# Patient Record
Sex: Female | Born: 1955 | Race: Black or African American | Hispanic: No | Marital: Single | Smoking: Current some day smoker
Health system: Southern US, Community
[De-identification: ages and names within clinical notes are randomized; demographics above are authoritative.]

## PROBLEM LIST (undated history)

## (undated) DIAGNOSIS — E119 Type 2 diabetes mellitus without complications: Secondary | ICD-10-CM

## (undated) DIAGNOSIS — K219 Gastro-esophageal reflux disease without esophagitis: Secondary | ICD-10-CM

## (undated) DIAGNOSIS — I10 Essential (primary) hypertension: Secondary | ICD-10-CM

## (undated) DIAGNOSIS — J45909 Unspecified asthma, uncomplicated: Secondary | ICD-10-CM

## (undated) HISTORY — PX: EYE SURGERY: SHX253

---

## 2004-10-09 ENCOUNTER — Emergency Department (HOSPITAL_COMMUNITY): Admission: EM | Admit: 2004-10-09 | Discharge: 2004-10-09 | Payer: Self-pay | Admitting: Emergency Medicine

## 2004-11-02 ENCOUNTER — Emergency Department (HOSPITAL_COMMUNITY): Admission: EM | Admit: 2004-11-02 | Discharge: 2004-11-03 | Payer: Self-pay | Admitting: Emergency Medicine

## 2009-06-21 ENCOUNTER — Ambulatory Visit: Payer: Self-pay | Admitting: Diagnostic Radiology

## 2009-06-21 ENCOUNTER — Emergency Department (HOSPITAL_BASED_OUTPATIENT_CLINIC_OR_DEPARTMENT_OTHER): Admission: EM | Admit: 2009-06-21 | Discharge: 2009-06-21 | Payer: Self-pay | Admitting: Emergency Medicine

## 2009-09-16 ENCOUNTER — Emergency Department (HOSPITAL_COMMUNITY): Admission: EM | Admit: 2009-09-16 | Discharge: 2009-09-16 | Payer: Self-pay | Admitting: Emergency Medicine

## 2010-08-18 ENCOUNTER — Ambulatory Visit (HOSPITAL_COMMUNITY): Admission: RE | Admit: 2010-08-18 | Payer: Self-pay | Source: Home / Self Care | Admitting: Obstetrics and Gynecology

## 2010-10-22 LAB — CBC
HCT: 40.3 % (ref 36.0–46.0)
MCH: 32.3 pg (ref 26.0–34.0)
MCHC: 34.6 g/dL (ref 30.0–36.0)
RBC: 4.32 MIL/uL (ref 3.87–5.11)
WBC: 5.4 10*3/uL (ref 4.0–10.5)

## 2010-10-22 LAB — COMPREHENSIVE METABOLIC PANEL
ALT: 34 U/L (ref 0–35)
Alkaline Phosphatase: 91 U/L (ref 39–117)
Calcium: 9.2 mg/dL (ref 8.4–10.5)
GFR calc Af Amer: >60 mL/min (ref >60)
Glucose, Bld: 430 mg/dL — ABNORMAL HIGH (ref 70–99)
Sodium: 130 mEq/L — ABNORMAL LOW (ref 135–145)
Total Bilirubin: 0.3 mg/dL (ref 0.3–1.2)
Total Protein: 7.3 g/dL (ref 6.0–8.3)

## 2010-10-28 LAB — POCT PREGNANCY, URINE: Preg Test, Ur: NEGATIVE

## 2010-10-28 LAB — COMPREHENSIVE METABOLIC PANEL
ALT: 35 U/L (ref 0–35)
Albumin: 4.1 g/dL (ref 3.5–5.2)
BUN: 9 mg/dL (ref 6–23)
Chloride: 100 mEq/L (ref 96–112)
Creatinine, Ser: 0.71 mg/dL (ref 0.4–1.2)
GFR calc Af Amer: >60 mL/min (ref >60)
Glucose, Bld: 131 mg/dL — ABNORMAL HIGH (ref 70–99)
Potassium: 3.4 mEq/L — ABNORMAL LOW (ref 3.5–5.1)
Total Protein: 7.3 g/dL (ref 6.0–8.3)

## 2010-10-28 LAB — DIFFERENTIAL
Basophils Absolute: 0 10*3/uL (ref 0.0–0.1)
Basophils Relative: 1 % (ref 0–1)
Eosinophils Absolute: 0.1 10*3/uL (ref 0.0–0.7)
Lymphs Abs: 1.6 10*3/uL (ref 0.7–4.0)
Monocytes Relative: 6 % (ref 3–12)

## 2010-10-28 LAB — LIPASE, BLOOD: Lipase: 43 U/L (ref 11–59)

## 2010-10-28 LAB — CBC
HCT: 39.6 % (ref 36.0–46.0)
Hemoglobin: 13.5 g/dL (ref 12.0–15.0)
MCHC: 34.2 g/dL (ref 30.0–36.0)
MCV: 94 fL (ref 78.0–100.0)
WBC: 5.3 10*3/uL (ref 4.0–10.5)

## 2010-10-28 LAB — URINALYSIS, ROUTINE W REFLEX MICROSCOPIC
Bilirubin Urine: NEGATIVE
Glucose, UA: NEGATIVE mg/dL
Leukocytes, UA: NEGATIVE

## 2010-10-28 LAB — URINE MICROSCOPIC-ADD ON

## 2011-01-19 ENCOUNTER — Other Ambulatory Visit: Payer: Self-pay | Admitting: Obstetrics and Gynecology

## 2011-01-19 ENCOUNTER — Other Ambulatory Visit (HOSPITAL_COMMUNITY)
Admission: RE | Admit: 2011-01-19 | Discharge: 2011-01-19 | Disposition: A | Discharge status: Home / Self Care | Payer: Medicaid Other | Source: Ambulatory Visit | Attending: Obstetrics and Gynecology | Admitting: Obstetrics and Gynecology

## 2011-01-19 DIAGNOSIS — Z124 Encounter for screening for malignant neoplasm of cervix: Secondary | ICD-10-CM | POA: Insufficient documentation

## 2011-01-19 DIAGNOSIS — Z1231 Encounter for screening mammogram for malignant neoplasm of breast: Secondary | ICD-10-CM

## 2011-01-26 ENCOUNTER — Ambulatory Visit (HOSPITAL_BASED_OUTPATIENT_CLINIC_OR_DEPARTMENT_OTHER)
Admission: RE | Admit: 2011-01-26 | Discharge: 2011-01-26 | Disposition: A | Discharge status: Home / Self Care | Payer: Medicaid Other | Source: Ambulatory Visit | Attending: Obstetrics and Gynecology | Admitting: Obstetrics and Gynecology

## 2011-01-26 DIAGNOSIS — Z1231 Encounter for screening mammogram for malignant neoplasm of breast: Secondary | ICD-10-CM | POA: Insufficient documentation

## 2011-03-21 ENCOUNTER — Emergency Department (HOSPITAL_COMMUNITY)
Admission: EM | Admit: 2011-03-21 | Discharge: 2011-03-21 | Disposition: A | Discharge status: Home / Self Care | Payer: Medicaid Other | Attending: Emergency Medicine | Admitting: Emergency Medicine

## 2011-03-21 DIAGNOSIS — R269 Unspecified abnormalities of gait and mobility: Secondary | ICD-10-CM | POA: Insufficient documentation

## 2011-03-21 DIAGNOSIS — E119 Type 2 diabetes mellitus without complications: Secondary | ICD-10-CM | POA: Insufficient documentation

## 2011-03-21 DIAGNOSIS — K089 Disorder of teeth and supporting structures, unspecified: Secondary | ICD-10-CM | POA: Insufficient documentation

## 2011-03-21 DIAGNOSIS — I10 Essential (primary) hypertension: Secondary | ICD-10-CM | POA: Insufficient documentation

## 2011-03-21 DIAGNOSIS — K08109 Complete loss of teeth, unspecified cause, unspecified class: Secondary | ICD-10-CM | POA: Insufficient documentation

## 2011-03-21 DIAGNOSIS — K08409 Partial loss of teeth, unspecified cause, unspecified class: Secondary | ICD-10-CM | POA: Insufficient documentation

## 2011-03-21 DIAGNOSIS — M25559 Pain in unspecified hip: Secondary | ICD-10-CM | POA: Insufficient documentation

## 2012-07-24 ENCOUNTER — Emergency Department (HOSPITAL_COMMUNITY): Payer: Medicaid Other

## 2012-07-24 ENCOUNTER — Encounter (HOSPITAL_COMMUNITY): Payer: Self-pay | Admitting: *Deleted

## 2012-07-24 ENCOUNTER — Emergency Department (HOSPITAL_COMMUNITY)
Admission: EM | Admit: 2012-07-24 | Discharge: 2012-07-24 | Disposition: A | Discharge status: Home / Self Care | Payer: Medicaid Other | Attending: Emergency Medicine | Admitting: Emergency Medicine

## 2012-07-24 DIAGNOSIS — R61 Generalized hyperhidrosis: Secondary | ICD-10-CM | POA: Insufficient documentation

## 2012-07-24 DIAGNOSIS — R05 Cough: Secondary | ICD-10-CM | POA: Insufficient documentation

## 2012-07-24 DIAGNOSIS — J029 Acute pharyngitis, unspecified: Secondary | ICD-10-CM | POA: Insufficient documentation

## 2012-07-24 DIAGNOSIS — R509 Fever, unspecified: Secondary | ICD-10-CM | POA: Insufficient documentation

## 2012-07-24 DIAGNOSIS — B349 Viral infection, unspecified: Secondary | ICD-10-CM

## 2012-07-24 DIAGNOSIS — F17200 Nicotine dependence, unspecified, uncomplicated: Secondary | ICD-10-CM | POA: Insufficient documentation

## 2012-07-24 DIAGNOSIS — I10 Essential (primary) hypertension: Secondary | ICD-10-CM | POA: Insufficient documentation

## 2012-07-24 DIAGNOSIS — R059 Cough, unspecified: Secondary | ICD-10-CM | POA: Insufficient documentation

## 2012-07-24 DIAGNOSIS — J45901 Unspecified asthma with (acute) exacerbation: Secondary | ICD-10-CM | POA: Insufficient documentation

## 2012-07-24 DIAGNOSIS — R0602 Shortness of breath: Secondary | ICD-10-CM | POA: Insufficient documentation

## 2012-07-24 DIAGNOSIS — J3489 Other specified disorders of nose and nasal sinuses: Secondary | ICD-10-CM | POA: Insufficient documentation

## 2012-07-24 DIAGNOSIS — E119 Type 2 diabetes mellitus without complications: Secondary | ICD-10-CM | POA: Insufficient documentation

## 2012-07-24 DIAGNOSIS — B9789 Other viral agents as the cause of diseases classified elsewhere: Secondary | ICD-10-CM | POA: Insufficient documentation

## 2012-07-24 DIAGNOSIS — K219 Gastro-esophageal reflux disease without esophagitis: Secondary | ICD-10-CM | POA: Insufficient documentation

## 2012-07-24 DIAGNOSIS — Z79899 Other long term (current) drug therapy: Secondary | ICD-10-CM | POA: Insufficient documentation

## 2012-07-24 HISTORY — DX: Gastro-esophageal reflux disease without esophagitis: K21.9

## 2012-07-24 HISTORY — DX: Type 2 diabetes mellitus without complications: E11.9

## 2012-07-24 HISTORY — DX: Unspecified asthma, uncomplicated: J45.909

## 2012-07-24 HISTORY — DX: Essential (primary) hypertension: I10

## 2012-07-24 MED ORDER — HYDROCODONE-ACETAMINOPHEN 7.5-500 MG/15ML PO SOLN
10.0000 mL | Freq: Once | ORAL | Status: AC
  Administered 2012-07-24: 10 mL via ORAL
  Filled 2012-07-24: qty 15

## 2012-07-24 MED ORDER — GUAIFENESIN 100 MG/5ML PO LIQD: 100.0000 mg | ORAL | Status: DC | PRN

## 2012-07-24 MED ORDER — SODIUM CHLORIDE 0.9 % IV BOLUS (SEPSIS)
1000.0000 mL | Freq: Once | INTRAVENOUS | Status: AC
  Administered 2012-07-24: 1000 mL via INTRAVENOUS

## 2012-07-24 MED ORDER — DOXYCYCLINE HYCLATE 100 MG PO CAPS: 100.0000 mg | ORAL_CAPSULE | Freq: Two times a day (BID) | ORAL | Status: DC

## 2012-07-24 MED ORDER — AZITHROMYCIN 250 MG PO TABS
500.0000 mg | ORAL_TABLET | Freq: Once | ORAL | Status: AC
  Administered 2012-07-24: 500 mg via ORAL
  Filled 2012-07-24: qty 2

## 2012-07-24 MED ORDER — HYDROCODONE-ACETAMINOPHEN 7.5-500 MG/15ML PO SOLN: 15.0000 mL | Freq: Four times a day (QID) | ORAL | Status: DC | PRN

## 2012-07-24 MED ORDER — ONDANSETRON HCL 4 MG/2ML IJ SOLN: 4.0000 mg | Freq: Once | INTRAMUSCULAR | Status: DC

## 2012-07-24 NOTE — ED Provider Notes (Signed)
History     CSN: [id number]  Arrival date & time 07/24/12  1747   First MD Initiated Contact with Patient 07/24/12 1845      Chief Complaint  Patient presents with  . Emesis  . Cough    (Consider location/radiation/quality/duration/timing/severity/associated sxs/prior treatment) HPI  56 year old female with history of asthma, history of hypertension, and history of diabetes presents complaining of URI symptoms. Patient reports for the past week she has had gradual onset of runny nose, sneezing, productive cough, body aches, and pleuritic chest pain. She reports she has been coughing so much that she has post tussive emesis.  She reports trying many over-the-counter medication including NyQuil, Tussionex, Benadryl, Mucinex for the symptoms without relief. She does not have an inhaler at home and rarely ever needs it. No prior history of intubations or ICU stay secondary to her asthma.  She is an occasional smoker.  Her primary complaint today is her persistent cough.  Denies hemoptysis.  Denies rash. No new medication changes except OTC meds.    Past Medical History  Diagnosis Date  . Hypertension   . Diabetes mellitus without complication   . Acid reflux   . Asthma     Past Surgical History  Procedure Date  . Cesarean section     No family history on file.  History  Substance Use Topics  . Smoking status: Current Some Day Smoker  . Smokeless tobacco: Not on file  . Alcohol Use: No    OB History    Grav Para Term Preterm Abortions TAB SAB Ect Mult Living                  Review of Systems  Constitutional: Positive for fever, chills and diaphoresis.  HENT: Positive for congestion, sore throat, rhinorrhea, sneezing and postnasal drip. Negative for mouth sores, trouble swallowing, neck pain, neck stiffness and voice change.   Respiratory: Positive for cough, choking and shortness of breath. Negative for wheezing and stridor.   Cardiovascular: Negative for chest pain.   Gastrointestinal: Negative for abdominal pain.  Skin: Negative for rash.  Neurological: Negative for headaches.    Allergies  Review of patient's allergies indicates no known allergies.  Home Medications   Current Outpatient Rx  Name  Route  Sig  Dispense  Refill  . DICLOFENAC SODIUM 75 MG PO TBEC   Oral   Take 75 mg by mouth 2 (two) times daily.         Marland Kitchen DOXAZOSIN MESYLATE 1 MG PO TABS   Oral   Take 1 mg by mouth at bedtime.         Marland Kitchen LEVOTHYROXINE SODIUM 100 MCG PO TABS   Oral   Take 100 mcg by mouth daily.         Marland Kitchen METFORMIN HCL 1000 MG PO TABS   Oral   Take 1,000 mg by mouth 2 (two) times daily with a meal.         . OMEPRAZOLE 40 MG PO CPDR   Oral   Take 40 mg by mouth daily.         . QUETIAPINE FUMARATE 300 MG PO TABS   Oral   Take 600 mg by mouth 2 (two) times daily.           Pulse 88  Resp 24  SpO2 100%  Physical Exam  Nursing note and vitals reviewed. Constitutional: She is oriented to person, place, and time. She appears well-developed and well-nourished. She appears distressed (  Appears uncomfortable with persistent cough, diaphoresis).  HENT:  Right Ear: External ear normal.  Left Ear: External ear normal.       Rhinorrhea  Posterior oropharyngeal erythema without exudates or tonsillar enlargement. No evidence of deep tissues infection.  Eyes: Conjunctivae normal and EOM are normal. Pupils are equal, round, and reactive to light.  Neck: Normal range of motion. Neck supple. No JVD present.  Cardiovascular: Normal rate and regular rhythm.   Pulmonary/Chest: Effort normal and breath sounds normal. No stridor. No respiratory distress. She has no wheezes. She has no rales.  Abdominal: Soft. There is no tenderness.  Musculoskeletal: She exhibits no edema.  Lymphadenopathy:    She has no cervical adenopathy.  Neurological: She is alert and oriented to person, place, and time.  Skin: Skin is warm. No rash noted.  Psychiatric: She has a  normal mood and affect.    ED Course  Procedures (including critical care time)  Dg Chest 2 View  07/24/2012  *RADIOLOGY REPORT*  Clinical Data: Cough and shortness of breath  CHEST - 2 VIEW  Comparison: None.  Findings: The heart and pulmonary vascularity are within normal limits.  The lungs are clear.  No acute bony abnormality is seen.  IMPRESSION: No acute abnormality noted.   Original Report Authenticated By: Alcide Clever, M.D.      1. Viral syndrome  MDM  Patient presents with flu-like symptoms for over a week. She has persistent cough here in the ED. Her lung examination is unremarkable. The oxygen saturation is 100% on room air.  My plan is to give patient Lortab elixir for cough, Zithromax for potential infection, and fluid as she is diaphoretic, likely from persistent coughing.    7:36 PM Her chest x-ray shows no evidence of infection. Her abdomen is nonsurgical.  Her BP is normal at 139/93.  Sats at 100& on RA.  Pt stable for discharge.  Care discussed with my attending.    BP 139/93  Pulse 73  Resp 24  SpO2 98%   8:03 PM I initially was going to prescribed Zithromax for this patient.  However it can cause prolonged QT due to cross reaction of her seroquel.  Therefore, i suggest no antibiotic needed for her as this is likely viral syndrome.  However, doxy were prescribed for her to use if her sxs persist for more than 10 days.  Pt is aware and agree with plan.        Fayrene Helper, PA-C 07/24/12 2004

## 2012-07-24 NOTE — ED Notes (Signed)
Per ems: pt from home, c/o flu like symptoms x1 week. N/v/d and strong productive cough x1 week. Last vomited last night. Lung fields clear per ems. bp 218/80, pulse 80, respirations 20, saO2 100% ra

## 2012-07-25 NOTE — ED Provider Notes (Signed)
Medical screening examination/treatment/procedure(s) were performed by non-physician practitioner and as supervising physician I was immediately available for consultation/collaboration.   Robie Oats H Madalynne Gutmann, MD 07/25/12 1457 

## 2013-09-03 ENCOUNTER — Other Ambulatory Visit: Payer: Self-pay | Admitting: Nurse Practitioner

## 2013-09-03 DIAGNOSIS — Z1231 Encounter for screening mammogram for malignant neoplasm of breast: Secondary | ICD-10-CM

## 2013-09-10 ENCOUNTER — Ambulatory Visit
Admission: RE | Admit: 2013-09-10 | Discharge: 2013-09-10 | Disposition: A | Discharge status: Home / Self Care | Payer: Medicaid Other | Source: Ambulatory Visit | Attending: Nurse Practitioner | Admitting: Nurse Practitioner

## 2013-09-10 DIAGNOSIS — Z1231 Encounter for screening mammogram for malignant neoplasm of breast: Secondary | ICD-10-CM

## 2014-09-24 ENCOUNTER — Encounter (HOSPITAL_COMMUNITY): Payer: Self-pay | Admitting: Family Medicine

## 2014-09-24 ENCOUNTER — Emergency Department (HOSPITAL_COMMUNITY): Payer: Medicaid Other

## 2014-09-24 ENCOUNTER — Emergency Department (HOSPITAL_COMMUNITY)
Admission: EM | Admit: 2014-09-24 | Discharge: 2014-09-24 | Disposition: A | Discharge status: Home / Self Care | Payer: Medicaid Other | Attending: Emergency Medicine | Admitting: Emergency Medicine

## 2014-09-24 DIAGNOSIS — E1165 Type 2 diabetes mellitus with hyperglycemia: Secondary | ICD-10-CM | POA: Diagnosis not present

## 2014-09-24 DIAGNOSIS — R079 Chest pain, unspecified: Secondary | ICD-10-CM | POA: Diagnosis present

## 2014-09-24 DIAGNOSIS — Z792 Long term (current) use of antibiotics: Secondary | ICD-10-CM | POA: Insufficient documentation

## 2014-09-24 DIAGNOSIS — Z79899 Other long term (current) drug therapy: Secondary | ICD-10-CM | POA: Diagnosis not present

## 2014-09-24 DIAGNOSIS — K219 Gastro-esophageal reflux disease without esophagitis: Secondary | ICD-10-CM | POA: Diagnosis not present

## 2014-09-24 DIAGNOSIS — R05 Cough: Secondary | ICD-10-CM

## 2014-09-24 DIAGNOSIS — J45909 Unspecified asthma, uncomplicated: Secondary | ICD-10-CM | POA: Insufficient documentation

## 2014-09-24 DIAGNOSIS — I10 Essential (primary) hypertension: Secondary | ICD-10-CM | POA: Diagnosis not present

## 2014-09-24 DIAGNOSIS — Z720 Tobacco use: Secondary | ICD-10-CM | POA: Diagnosis not present

## 2014-09-24 DIAGNOSIS — Z791 Long term (current) use of non-steroidal anti-inflammatories (NSAID): Secondary | ICD-10-CM | POA: Diagnosis not present

## 2014-09-24 DIAGNOSIS — R059 Cough, unspecified: Secondary | ICD-10-CM

## 2014-09-24 LAB — CBC WITH DIFFERENTIAL/PLATELET
Basophils Absolute: 0 10*3/uL (ref 0.0–0.1)
Basophils Relative: 0 % (ref 0–1)
EOS ABS: 0 10*3/uL (ref 0.0–0.7)
EOS PCT: 1 % (ref 0–5)
HCT: 31.5 % — ABNORMAL LOW (ref 36.0–46.0)
HEMOGLOBIN: 9.8 g/dL — AB (ref 12.0–15.0)
LYMPHS ABS: 0.8 10*3/uL (ref 0.7–4.0)
Lymphocytes Relative: 17 % (ref 12–46)
MCH: 32.1 pg (ref 26.0–34.0)
MCHC: 31.1 g/dL (ref 30.0–36.0)
MCV: 103.3 fL — ABNORMAL HIGH (ref 78.0–100.0)
MONOS PCT: 6 % (ref 3–12)
Monocytes Absolute: 0.3 10*3/uL (ref 0.1–1.0)
Neutro Abs: 3.8 10*3/uL (ref 1.7–7.7)
Neutrophils Relative %: 76 % (ref 43–77)
Platelets: 203 10*3/uL (ref 150–400)
RBC: 3.05 MIL/uL — ABNORMAL LOW (ref 3.87–5.11)
RDW: 13.6 % (ref 11.5–15.5)
WBC: 5 10*3/uL (ref 4.0–10.5)

## 2014-09-24 LAB — I-STAT CHEM 8, ED
BUN: 5 mg/dL — AB (ref 6–23)
CALCIUM ION: 0.88 mmol/L — AB (ref 1.12–1.23)
CHLORIDE: 111 mmol/L (ref 96–112)
CREATININE: 0.4 mg/dL — AB (ref 0.50–1.10)
GLUCOSE: 90 mg/dL (ref 70–99)
HCT: 32 % — ABNORMAL LOW (ref 36.0–46.0)
Hemoglobin: 10.9 g/dL — ABNORMAL LOW (ref 12.0–15.0)
POTASSIUM: 2.6 mmol/L — AB (ref 3.5–5.1)
Sodium: 144 mmol/L (ref 135–145)
TCO2: 15 mmol/L (ref 0–100)

## 2014-09-24 LAB — BASIC METABOLIC PANEL
ANION GAP: 5 (ref 5–15)
BUN: 7 mg/dL (ref 6–23)
CALCIUM: 6 mg/dL — AB (ref 8.4–10.5)
CO2: 19 mmol/L (ref 19–32)
CREATININE: 0.32 mg/dL — AB (ref 0.50–1.10)
Chloride: 115 mmol/L — ABNORMAL HIGH (ref 96–112)
GFR calc Af Amer: >90 mL/min (ref >90)
GFR calc non Af Amer: >90 mL/min (ref >90)
Glucose, Bld: 91 mg/dL (ref 70–99)
POTASSIUM: 2.7 mmol/L — AB (ref 3.5–5.1)
Sodium: 139 mmol/L (ref 135–145)

## 2014-09-24 LAB — CBG MONITORING, ED: GLUCOSE-CAPILLARY: 129 mg/dL — AB (ref 70–99)

## 2014-09-24 LAB — D-DIMER, QUANTITATIVE: D-Dimer, Quant: <0.27 ug/mL-FEU (ref 0.00–0.48)

## 2014-09-24 MED ORDER — IOHEXOL 350 MG/ML SOLN
100.0000 mL | Freq: Once | INTRAVENOUS | Status: AC | PRN
  Administered 2014-09-24: 100 mL via INTRAVENOUS

## 2014-09-24 MED ORDER — POTASSIUM CHLORIDE 10 MEQ/100ML IV SOLN
10.0000 meq | Freq: Once | INTRAVENOUS | Status: AC
Start: 2014-09-24 — End: 2014-09-24
  Administered 2014-09-24: 10 meq via INTRAVENOUS
  Filled 2014-09-24: qty 100

## 2014-09-24 MED ORDER — POTASSIUM CHLORIDE CRYS ER 20 MEQ PO TBCR
60.0000 meq | EXTENDED_RELEASE_TABLET | Freq: Once | ORAL | Status: AC
  Administered 2014-09-24: 60 meq via ORAL
  Filled 2014-09-24: qty 3

## 2014-09-24 MED ORDER — MAGNESIUM SULFATE 2 GM/50ML IV SOLN
2.0000 g | Freq: Once | INTRAVENOUS | Status: AC
  Administered 2014-09-24: 2 g via INTRAVENOUS
  Filled 2014-09-24: qty 50

## 2014-09-24 MED ORDER — SODIUM CHLORIDE 0.9 % IV SOLN
2.0000 g | Freq: Once | INTRAVENOUS | Status: AC
  Administered 2014-09-24: 2 g via INTRAVENOUS
  Filled 2014-09-24: qty 20

## 2014-09-24 MED ORDER — SODIUM CHLORIDE 0.9 % IV BOLUS (SEPSIS)
1000.0000 mL | Freq: Once | INTRAVENOUS | Status: AC
  Administered 2014-09-24: 1000 mL via INTRAVENOUS

## 2014-09-24 MED ORDER — IOHEXOL 300 MG/ML  SOLN: 100.0000 mL | Freq: Once | INTRAMUSCULAR | Status: DC | PRN

## 2014-09-24 MED ORDER — LORAZEPAM 2 MG/ML IJ SOLN
1.0000 mg | Freq: Once | INTRAMUSCULAR | Status: AC
  Administered 2014-09-24: 1 mg via INTRAVENOUS
  Filled 2014-09-24: qty 1

## 2014-09-24 NOTE — ED Notes (Signed)
KAREN UNIT SECT. WITNESSED PT USING PHONE. PT AWARE- #1 OF HER PHONE

## 2014-09-24 NOTE — ED Provider Notes (Signed)
CSN: [id number]     Arrival date & time 09/24/14  0341 History   First MD Initiated Contact with Patient 09/24/14 0354     Chief Complaint  Patient presents with  . Chest Pain  . Cough  . Hypertension  . Hyperglycemia     (Consider location/radiation/quality/duration/timing/severity/associated sxs/prior Treatment) HPI  Jodi Kim is a 59 y.o. female with past medical history of hypertension, diabetes presenting today with chest pain. Patient states he smoked crack cocaine. She then had right-sided chest pain with radiation to her back. She tried to treat her pain with alcohol however this did not help. She denies any exertional component. She does have shortness of breath, there is no emesis or diaphoresis. Patient does have a history of DVT in the right lower extremity many years ago. She denies any history of pulmonary embolism. Patient has no further complaints.  10 Systems reviewed and are negative for acute change except as noted in the HPI.     Past Medical History  Diagnosis Date  . Hypertension   . Diabetes mellitus without complication   . Acid reflux   . Asthma    Past Surgical History  Procedure Laterality Date  . Cesarean section     History reviewed. No pertinent family history. History  Substance Use Topics  . Smoking status: Current Some Day Smoker    Types: Cigarettes  . Smokeless tobacco: Not on file  . Alcohol Use: Yes     Comment: Twice a week. Last drink: PTA. Beer 40oz and some liqour   OB History    No data available     Review of Systems    Allergies  Review of patient's allergies indicates no known allergies.  Home Medications   Prior to Admission medications   Medication Sig Start Date End Date Taking? Authorizing Provider  diclofenac (VOLTAREN) 75 MG EC tablet Take 75 mg by mouth 2 (two) times daily.    Historical Provider, MD  doxazosin (CARDURA) 1 MG tablet Take 1 mg by mouth at bedtime.    Historical Provider, MD  doxycycline  (VIBRAMYCIN) 100 MG capsule Take 1 capsule (100 mg total) by mouth 2 (two) times daily. 07/24/12   Fayrene Helper, PA-C  guaiFENesin (ROBITUSSIN) 100 MG/5ML liquid Take 5-10 mLs (100-200 mg total) by mouth every 4 (four) hours as needed for cough. 07/24/12   Fayrene Helper, PA-C  HYDROcodone-acetaminophen (LORTAB) 7.5-500 MG/15ML solution Take 15 mLs by mouth every 6 (six) hours as needed for pain or cough. 07/24/12   Fayrene Helper, PA-C  levothyroxine (SYNTHROID, LEVOTHROID) 100 MCG tablet Take 100 mcg by mouth daily.    Historical Provider, MD  metFORMIN (GLUCOPHAGE) 1000 MG tablet Take 1,000 mg by mouth 2 (two) times daily with a meal.    Historical Provider, MD  omeprazole (PRILOSEC) 40 MG capsule Take 40 mg by mouth daily.    Historical Provider, MD  QUEtiapine (SEROQUEL) 300 MG tablet Take 600 mg by mouth 2 (two) times daily.    Historical Provider, MD   BP 195/103 mmHg  Pulse 82  Temp(Src) 98.5 F (36.9 C) (Oral)  Resp 20  Ht  (1.626 m)  Wt 263 lb (119.296 kg)  BMI 45.12 kg/m2  SpO2 99% Physical Exam  Constitutional: She is oriented to person, place, and time. She appears well-developed and well-nourished. She appears distressed.  HENT:  Head: Normocephalic and atraumatic.  Nose: Nose normal.  Mouth/Throat: Oropharynx is clear and moist. No oropharyngeal exudate.  Eyes: Conjunctivae and  EOM are normal. Pupils are equal, round, and reactive to light. No scleral icterus.  Neck: Normal range of motion. Neck supple. No JVD present. No tracheal deviation present. No thyromegaly present.  Cardiovascular: Normal rate, regular rhythm and normal heart sounds.  Exam reveals no gallop and no friction rub.   No murmur heard. Pulmonary/Chest: Effort normal and breath sounds normal. No respiratory distress. She has no wheezes. She exhibits no tenderness.  Abdominal: Soft. Bowel sounds are normal. She exhibits no distension and no mass. There is no tenderness. There is no rebound and no guarding.   Musculoskeletal: Normal range of motion. She exhibits no edema or tenderness.  Lymphadenopathy:    She has no cervical adenopathy.  Neurological: She is alert and oriented to person, place, and time. No cranial nerve deficit. She exhibits normal muscle tone.  Skin: Skin is warm and dry. No rash noted. She is not diaphoretic. No erythema. No pallor.  Nursing note and vitals reviewed.   ED Course  Procedures (including critical care time) Labs Review Labs Reviewed  CBC WITH DIFFERENTIAL/PLATELET - Abnormal; Notable for the following:    RBC 3.05 (*)    Hemoglobin 9.8 (*)    HCT 31.5 (*)    MCV 103.3 (*)    All other components within normal limits  BASIC METABOLIC PANEL - Abnormal; Notable for the following:    Potassium 2.7 (*)    Chloride 115 (*)    Creatinine, Ser 0.32 (*)    Calcium 6.0 (*)    All other components within normal limits  CBG MONITORING, ED - Abnormal; Notable for the following:    Glucose-Capillary 129 (*)    All other components within normal limits  I-STAT CHEM 8, ED - Abnormal; Notable for the following:    Potassium 2.6 (*)    BUN 5 (*)    Creatinine, Ser 0.40 (*)    Calcium, Ion 0.88 (*)    Hemoglobin 10.9 (*)    HCT 32.0 (*)    All other components within normal limits  D-DIMER, QUANTITATIVE    Imaging Review Dg Chest 2 View  09/24/2014   CLINICAL DATA:  Cough and midchest pain, 1 day duration. Initial encounter.  EXAM: CHEST  2 VIEW  COMPARISON:  07/24/2012  FINDINGS: The heart size and mediastinal contours are within normal limits. Both lungs are clear. The visualized skeletal structures are unremarkable.  There is no significant interval change.  IMPRESSION: No active cardiopulmonary disease.   Electronically Signed   By: Ellery Plunk M.D.   On: 09/24/2014 04:10   Ct Angio Chest Aorta W/cm &/or Wo/cm  09/24/2014   CLINICAL DATA:  Cough and chest pain.  EXAM: CT ANGIOGRAPHY CHEST WITH CONTRAST  TECHNIQUE: Multidetector CT imaging of the chest  was performed using the standard protocol during bolus administration of intravenous contrast. Multiplanar CT image reconstructions and MIPs were obtained to evaluate the vascular anatomy.  CONTRAST:  OMNIPAQUE IOHEXOL 350 MG/ML SOLN  COMPARISON:  Radiograph 09/24/2014  FINDINGS: There is good opacification of pulmonary vasculature but moderate motion artifact. There are no large or central pulmonary emboli. A few nonspecific nodes are present in the mediastinum and hila. No large effusions are evident. The lungs are grossly clear, without significant confluent consolidation. Small nodules or masses cannot be excluded due to the motion artifact.  Review of the MIP images confirms the above findings.  IMPRESSION: 1. The images are moderately degraded by motion artifact as the patient was unable to  cooperate with instructions. 2. No large or central pulmonary emboli 3. No large effusions or airspace consolidation   Electronically Signed   By: Ellery Plunkaniel R Mitchell M.D.   On: 09/24/2014 06:09     EKG Interpretation   Date/Time:  Tuesday September 24 2014 03:48:14 EST Ventricular Rate:  83 PR Interval:  136 QRS Duration: 84 QT Interval:  398 QTC Calculation: 468 R Axis:   59 Text Interpretation:  Sinus rhythm Probable left atrial enlargement  Anteroseptal infarct, old Nonspecific T abnormalities, lateral leads  Confirmed by Erroll Lunani, Andros Channing Ayokunle [phone]([id number]) on 09/24/2014 3:54:19 AM      MDM   Final diagnoses:  Cough    Patient presents emergency department for chest pain, in the setting of recent crack cocaine use. She describes it as radiating to her back. This history is not consistent with ACS.  Pulmonary Embolism is a possibility as the patient has a history of clots although less likely in this situation. Aortic dissection is also possible given her recent history of cocaine use and ripping back pain radiating to her back. EKG is nonischemic. Will evaluate with CT scan, patient given IV fluids  and Ativan for treatment.  CT scan does not show any evidence of PE.  I called the radiologist to confirm that there is no dissection as well, and he did see anything. Patient's electrolytes will replace potassium, magnesium, calcium. I do not believe her chest pain is consistent with ACS. Is likely due to her recent use of crack cocaine. Patient's vital signs remain within her normal limits. Hypertension is likely due to cocaine use. Patient will be discharged with primary care follow-up. She is safe for discharge.  Tomasita CrumbleAdeleke Davidlee Jeanbaptiste, MD 09/24/14 [phone]

## 2014-09-24 NOTE — ED Notes (Signed)
Daughter present

## 2014-09-24 NOTE — Discharge Instructions (Signed)
Chest Pain (Nonspecific) [name], your CT scan did not show a cause for your chest pain. Follow-up with a primary care physician within 3 days for continued management. If symptoms worsen come back to the emergency department immediately. Thank you. It is often hard to give a diagnosis for the cause of chest pain. There is always a chance that your pain could be related to something serious, such as a heart attack or a blood clot in the lungs. You need to follow up with your doctor. HOME CARE  If antibiotic medicine was given, take it as directed by your doctor. Finish the medicine even if you start to feel better.  For the next few days, avoid activities that bring on chest pain. Continue physical activities as told by your doctor.  Do not use any tobacco products. This includes cigarettes, chewing tobacco, and e-cigarettes.  Avoid drinking alcohol.  Only take medicine as told by your doctor.  Follow your doctor's suggestions for more testing if your chest pain does not go away.  Keep all doctor visits you made. GET HELP IF:  Your chest pain does not go away, even after treatment.  You have a rash with blisters on your chest.  You have a fever. GET HELP RIGHT AWAY IF:   You have more pain or pain that spreads to your arm, neck, jaw, back, or belly (abdomen).  You have shortness of breath.  You cough more than usual or cough up blood.  You have very bad back or belly pain.  You feel sick to your stomach (nauseous) or throw up (vomit).  You have very bad weakness.  You pass out (faint).  You have chills. This is an emergency. Do not wait to see if the problems will go away. Call your local emergency services (911 in [place] Do not drive yourself to the hospital. MAKE SURE YOU:   Understand these instructions.  Will watch your condition.  Will get help right away if you are not doing well or get worse. Document Released: 01/12/2008 Document Revised: 07/31/2013  Document Reviewed: 01/12/2008 [employer] Patient Information 2015 [place], [place]. This information is not intended to replace advice given to you by your health care provider. Make sure you discuss any questions you have with your health care provider.

## 2014-09-24 NOTE — ED Notes (Signed)
Critical value of POTASSIUM 2.7mg  and CALCIUM 6.0. Confirmed by Vira BrownsWendy Shea in Lab. Notified Dr. Mora Bellmanni.

## 2014-09-24 NOTE — ED Notes (Signed)
DAUGHTER STATED PT ALREADY HAS A PCP. DISCUSSED WITH DAUGHTER TO FOLLOW UP WITH PCP IN 3 DAYS PER DISCHARGE. DISCUSSED METFORMIN PAPERWORK PER CT SCAN INSTRUCTIONS. DAUGHTER VERBALIZED UNDERSTANDING HOWEVER SHE STATED MOTHER WOULD NOT BE LIVING WITH HER.

## 2014-09-24 NOTE — ED Notes (Signed)
Bed: WA10 Expected date: 09/24/14 Expected time: 3:29 AM Means of arrival: Ambulance Comments: HTN, hyperglycemia

## 2014-09-24 NOTE — ED Notes (Signed)
Per Velna HatchetSheila in main lab, BMP to be added to blood collected at 0450hrs

## 2015-02-20 ENCOUNTER — Encounter (HOSPITAL_COMMUNITY): Payer: Self-pay | Admitting: Emergency Medicine

## 2015-02-20 ENCOUNTER — Emergency Department (HOSPITAL_COMMUNITY)
Admission: EM | Admit: 2015-02-20 | Discharge: 2015-02-20 | Disposition: A | Discharge status: Home / Self Care | Payer: Medicaid Other | Attending: Emergency Medicine | Admitting: Emergency Medicine

## 2015-02-20 DIAGNOSIS — J45909 Unspecified asthma, uncomplicated: Secondary | ICD-10-CM | POA: Insufficient documentation

## 2015-02-20 DIAGNOSIS — K219 Gastro-esophageal reflux disease without esophagitis: Secondary | ICD-10-CM | POA: Insufficient documentation

## 2015-02-20 DIAGNOSIS — R51 Headache: Secondary | ICD-10-CM | POA: Diagnosis not present

## 2015-02-20 DIAGNOSIS — I10 Essential (primary) hypertension: Secondary | ICD-10-CM | POA: Diagnosis present

## 2015-02-20 DIAGNOSIS — Z79899 Other long term (current) drug therapy: Secondary | ICD-10-CM | POA: Diagnosis not present

## 2015-02-20 DIAGNOSIS — H538 Other visual disturbances: Secondary | ICD-10-CM | POA: Insufficient documentation

## 2015-02-20 DIAGNOSIS — I159 Secondary hypertension, unspecified: Secondary | ICD-10-CM | POA: Diagnosis not present

## 2015-02-20 DIAGNOSIS — Z720 Tobacco use: Secondary | ICD-10-CM | POA: Insufficient documentation

## 2015-02-20 DIAGNOSIS — E119 Type 2 diabetes mellitus without complications: Secondary | ICD-10-CM | POA: Insufficient documentation

## 2015-02-20 DIAGNOSIS — F1410 Cocaine abuse, uncomplicated: Secondary | ICD-10-CM | POA: Insufficient documentation

## 2015-02-20 MED ORDER — HYDRALAZINE HCL 20 MG/ML IJ SOLN
10.0000 mg | Freq: Once | INTRAMUSCULAR | Status: AC
  Administered 2015-02-20: 10 mg via INTRAVENOUS
  Filled 2015-02-20: qty 1

## 2015-02-20 NOTE — ED Provider Notes (Signed)
I saw and evaluated the patient, reviewed the resident's note and I agree with the findings and plan.   EKG Interpretation   Date/Time:  Thursday February 20 2015 18:03:38 EDT Ventricular Rate:  73 PR Interval:  137 QRS Duration: 85 QT Interval:  427 QTC Calculation: 470 R Axis:   65 Text Interpretation:  Sinus arrhythmia Anteroseptal infarct, age  indeterminate No significant change since last tracing Confirmed by Jayr Lupercio   MD, Quin Mcpherson ([id number]) on 02/20/2015 7:14:14 PM     Patient here in custody of law enforcement complaining of high blood pressure and dizziness. Possible cocaine use. No neurological deficits. Denies any chest pain. Given hydralazine here. We'll continue to monitor  Lorre NickAnthony Shyvonne Chastang, MD 02/20/15 1941

## 2015-02-20 NOTE — ED Notes (Signed)
Pt arrives via [facility] in [employer] custody. Pt has hx of crack/cocaine abuse, last use today. Reports htn and dizziness, pt noncompliant with her medications. Pt alert, oriented.

## 2015-02-20 NOTE — ED Notes (Signed)
Lab contacted for cbc and bmet results. Advised they are tubing results to ed now.

## 2015-02-20 NOTE — Discharge Instructions (Signed)
Hypertension Hypertension is another name for high blood pressure. High blood pressure forces your heart to work harder to pump blood. A blood pressure reading has two numbers, which includes a higher number over a lower number (example: 110/72). HOME CARE   Have your blood pressure rechecked by your doctor.  Only take medicine as told by your doctor. Follow the directions carefully. The medicine does not work as well if you skip doses. Skipping doses also puts you at risk for problems.  Do not smoke.  Monitor your blood pressure at home as told by your doctor. GET HELP IF:  You think you are having a reaction to the medicine you are taking.  You have repeat headaches or feel dizzy.  You have puffiness (swelling) in your ankles.  You have trouble with your vision. GET HELP RIGHT AWAY IF:   You get a very bad headache and are confused.  You feel weak, numb, or faint.  You get chest or belly (abdominal) pain.  You throw up (vomit).  You cannot breathe very well. MAKE SURE YOU:   Understand these instructions.  Will watch your condition.  Will get help right away if you are not doing well or get worse. Document Released: 01/12/2008 Document Revised: 07/31/2013 Document Reviewed: 05/18/2013 [employer] Patient Information 2015 [employer]. This information is not intended to replace advice given to you by your health care provider. Make sure you discuss any questions you have with your health care provider.  

## 2015-02-20 NOTE — ED Notes (Signed)
MD at bedside. 
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
## 2015-02-20 NOTE — ED Notes (Signed)
Lab results given to Dr.Allen. 

## 2015-02-21 LAB — BASIC METABOLIC PANEL
ANION GAP: 12 (ref 5–15)
BUN: 12 mg/dL (ref 6–20)
CO2: 22 mmol/L (ref 22–32)
Calcium: 8.7 mg/dL — ABNORMAL LOW (ref 8.9–10.3)
Chloride: 106 mmol/L (ref 101–111)
Creatinine, Ser: 0.76 mg/dL (ref 0.44–1.00)
GFR calc Af Amer: >60 mL/min (ref >60)
GFR calc non Af Amer: >60 mL/min (ref >60)
GLUCOSE: 115 mg/dL — AB (ref 65–99)
POTASSIUM: 3.8 mmol/L (ref 3.5–5.1)
Sodium: 140 mmol/L (ref 135–145)

## 2015-02-21 LAB — RAPID URINE DRUG SCREEN, HOSP PERFORMED
AMPHETAMINES: NOT DETECTED
BARBITURATES: NOT DETECTED
BENZODIAZEPINES: NOT DETECTED
Cocaine: POSITIVE — AB
Opiates: NOT DETECTED
Tetrahydrocannabinol: NOT DETECTED

## 2015-02-21 LAB — I-STAT TROPONIN, ED: Troponin i, poc: 0 ng/mL (ref 0.00–0.08)

## 2015-02-21 LAB — CBC
HEMATOCRIT: 44.2 % (ref 36.0–46.0)
Hemoglobin: 14.5 g/dL (ref 12.0–15.0)
MCH: 33 pg (ref 26.0–34.0)
MCHC: 32.8 g/dL (ref 30.0–36.0)
MCV: 100.7 fL — ABNORMAL HIGH (ref 78.0–100.0)
Platelets: 266 10*3/uL (ref 150–400)
RBC: 4.39 MIL/uL (ref 3.87–5.11)
RDW: 13.8 % (ref 11.5–15.5)
WBC: 6.5 10*3/uL (ref 4.0–10.5)

## 2021-04-07 ENCOUNTER — Other Ambulatory Visit: Payer: Self-pay

## 2021-04-07 ENCOUNTER — Emergency Department (HOSPITAL_BASED_OUTPATIENT_CLINIC_OR_DEPARTMENT_OTHER): Payer: Medicaid Other

## 2021-04-07 ENCOUNTER — Emergency Department (HOSPITAL_BASED_OUTPATIENT_CLINIC_OR_DEPARTMENT_OTHER)
Admission: EM | Admit: 2021-04-07 | Discharge: 2021-04-07 | Disposition: A | Discharge status: Home / Self Care | Payer: Medicaid Other | Attending: Emergency Medicine | Admitting: Emergency Medicine

## 2021-04-07 ENCOUNTER — Encounter (HOSPITAL_BASED_OUTPATIENT_CLINIC_OR_DEPARTMENT_OTHER): Payer: Self-pay | Admitting: Emergency Medicine

## 2021-04-07 DIAGNOSIS — Z7984 Long term (current) use of oral hypoglycemic drugs: Secondary | ICD-10-CM | POA: Insufficient documentation

## 2021-04-07 DIAGNOSIS — R0789 Other chest pain: Secondary | ICD-10-CM | POA: Diagnosis not present

## 2021-04-07 DIAGNOSIS — F17210 Nicotine dependence, cigarettes, uncomplicated: Secondary | ICD-10-CM | POA: Insufficient documentation

## 2021-04-07 DIAGNOSIS — I10 Essential (primary) hypertension: Secondary | ICD-10-CM | POA: Insufficient documentation

## 2021-04-07 DIAGNOSIS — Y92410 Unspecified street and highway as the place of occurrence of the external cause: Secondary | ICD-10-CM | POA: Diagnosis not present

## 2021-04-07 DIAGNOSIS — M25562 Pain in left knee: Secondary | ICD-10-CM | POA: Insufficient documentation

## 2021-04-07 DIAGNOSIS — R519 Headache, unspecified: Secondary | ICD-10-CM | POA: Insufficient documentation

## 2021-04-07 DIAGNOSIS — M25552 Pain in left hip: Secondary | ICD-10-CM | POA: Diagnosis not present

## 2021-04-07 DIAGNOSIS — Z7951 Long term (current) use of inhaled steroids: Secondary | ICD-10-CM | POA: Insufficient documentation

## 2021-04-07 DIAGNOSIS — J45909 Unspecified asthma, uncomplicated: Secondary | ICD-10-CM | POA: Insufficient documentation

## 2021-04-07 DIAGNOSIS — M542 Cervicalgia: Secondary | ICD-10-CM | POA: Diagnosis not present

## 2021-04-07 DIAGNOSIS — Z79899 Other long term (current) drug therapy: Secondary | ICD-10-CM | POA: Insufficient documentation

## 2021-04-07 DIAGNOSIS — E119 Type 2 diabetes mellitus without complications: Secondary | ICD-10-CM | POA: Diagnosis not present

## 2021-04-07 DIAGNOSIS — R209 Unspecified disturbances of skin sensation: Secondary | ICD-10-CM | POA: Insufficient documentation

## 2021-04-07 MED ORDER — ACETAMINOPHEN 325 MG PO TABS
650.0000 mg | ORAL_TABLET | Freq: Once | ORAL | Status: AC
  Administered 2021-04-07: 650 mg via ORAL
  Filled 2021-04-07: qty 2

## 2021-04-07 MED ORDER — CYCLOBENZAPRINE HCL 5 MG PO TABS
5.0000 mg | ORAL_TABLET | Freq: Once | ORAL | Status: AC
  Administered 2021-04-07: 5 mg via ORAL
  Filled 2021-04-07: qty 1
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
## 2021-04-07 NOTE — ED Notes (Signed)
ED Provider at bedside. 

## 2021-04-07 NOTE — ED Notes (Signed)
Pt provided bedside commode, assistance to bedside
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
## 2021-04-07 NOTE — ED Notes (Signed)
Pt discharged to home. Discharge instructions have been discussed with patient and/or family members. Pt verbally acknowledges understanding d/c instructions, and endorses comprehension to checkout at registration before leaving.  °
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
## 2021-04-07 NOTE — ED Notes (Signed)
Patient transported to X-ray 
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
## 2021-06-04 ENCOUNTER — Other Ambulatory Visit: Payer: Self-pay

## 2021-06-04 ENCOUNTER — Emergency Department (HOSPITAL_BASED_OUTPATIENT_CLINIC_OR_DEPARTMENT_OTHER)
Admission: EM | Admit: 2021-06-04 | Discharge: 2021-06-04 | Disposition: A | Discharge status: Home / Self Care | Payer: Medicaid Other | Attending: Emergency Medicine | Admitting: Emergency Medicine

## 2021-06-04 ENCOUNTER — Emergency Department (HOSPITAL_BASED_OUTPATIENT_CLINIC_OR_DEPARTMENT_OTHER): Payer: Medicaid Other

## 2021-06-04 DIAGNOSIS — Z20822 Contact with and (suspected) exposure to covid-19: Secondary | ICD-10-CM | POA: Diagnosis not present

## 2021-06-04 DIAGNOSIS — R059 Cough, unspecified: Secondary | ICD-10-CM | POA: Diagnosis not present

## 2021-06-04 DIAGNOSIS — F17210 Nicotine dependence, cigarettes, uncomplicated: Secondary | ICD-10-CM | POA: Insufficient documentation

## 2021-06-04 DIAGNOSIS — E876 Hypokalemia: Secondary | ICD-10-CM

## 2021-06-04 DIAGNOSIS — E119 Type 2 diabetes mellitus without complications: Secondary | ICD-10-CM | POA: Diagnosis not present

## 2021-06-04 DIAGNOSIS — R509 Fever, unspecified: Secondary | ICD-10-CM | POA: Diagnosis not present

## 2021-06-04 DIAGNOSIS — J45909 Unspecified asthma, uncomplicated: Secondary | ICD-10-CM | POA: Insufficient documentation

## 2021-06-04 DIAGNOSIS — R0602 Shortness of breath: Secondary | ICD-10-CM | POA: Diagnosis present

## 2021-06-04 DIAGNOSIS — I10 Essential (primary) hypertension: Secondary | ICD-10-CM | POA: Insufficient documentation

## 2021-06-04 DIAGNOSIS — Z7984 Long term (current) use of oral hypoglycemic drugs: Secondary | ICD-10-CM | POA: Insufficient documentation

## 2021-06-04 DIAGNOSIS — Z79899 Other long term (current) drug therapy: Secondary | ICD-10-CM | POA: Diagnosis not present

## 2021-06-04 DIAGNOSIS — J101 Influenza due to other identified influenza virus with other respiratory manifestations: Secondary | ICD-10-CM | POA: Insufficient documentation

## 2021-06-04 DIAGNOSIS — J111 Influenza due to unidentified influenza virus with other respiratory manifestations: Secondary | ICD-10-CM

## 2021-06-04 LAB — BASIC METABOLIC PANEL
Anion gap: 11 (ref 5–15)
BUN: 11 mg/dL (ref 8–23)
CO2: 21 mmol/L — ABNORMAL LOW (ref 22–32)
Calcium: 8.1 mg/dL — ABNORMAL LOW (ref 8.9–10.3)
Chloride: 103 mmol/L (ref 98–111)
Creatinine, Ser: 0.68 mg/dL (ref 0.44–1.00)
GFR, Estimated: >60 mL/min (ref >60)
Glucose, Bld: 288 mg/dL — ABNORMAL HIGH (ref 70–99)
Potassium: 3 mmol/L — ABNORMAL LOW (ref 3.5–5.1)
Sodium: 135 mmol/L (ref 135–145)

## 2021-06-04 LAB — RESP PANEL BY RT-PCR (FLU A&B, COVID) ARPGX2
Influenza A by PCR: POSITIVE — AB
Influenza B by PCR: NEGATIVE
SARS Coronavirus 2 by RT PCR: NEGATIVE

## 2021-06-04 LAB — CBC
HCT: 41.3 % (ref 36.0–46.0)
Hemoglobin: 13.4 g/dL (ref 12.0–15.0)
MCH: 33.8 pg (ref 26.0–34.0)
MCHC: 32.4 g/dL (ref 30.0–36.0)
MCV: 104 fL — ABNORMAL HIGH (ref 80.0–100.0)
Platelets: 179 10*3/uL (ref 150–400)
RBC: 3.97 MIL/uL (ref 3.87–5.11)
RDW: 13.2 % (ref 11.5–15.5)
WBC: 4.5 10*3/uL (ref 4.0–10.5)
nRBC: 0 % (ref 0.0–0.2)

## 2021-06-04 LAB — TROPONIN I (HIGH SENSITIVITY): Troponin I (High Sensitivity): 12 ng/L (ref <18)

## 2021-06-04 MED ORDER — OSELTAMIVIR PHOSPHATE 75 MG PO CAPS
75.0000 mg | ORAL_CAPSULE | Freq: Once | ORAL | Status: AC
  Administered 2021-06-04: 75 mg via ORAL
  Filled 2021-06-04: qty 1

## 2021-06-04 MED ORDER — ALBUTEROL SULFATE HFA 108 (90 BASE) MCG/ACT IN AERS
2.0000 | INHALATION_SPRAY | Freq: Once | RESPIRATORY_TRACT | Status: AC
  Administered 2021-06-04: 2 via RESPIRATORY_TRACT
  Filled 2021-06-04: qty 6.7

## 2021-06-04 MED ORDER — OSELTAMIVIR PHOSPHATE 75 MG PO CAPS: 75.0000 mg | ORAL_CAPSULE | Freq: Two times a day (BID) | ORAL | 0 refills | Status: DC

## 2021-06-04 MED ORDER — POTASSIUM CHLORIDE CRYS ER 20 MEQ PO TBCR: 20.0000 meq | EXTENDED_RELEASE_TABLET | Freq: Every day | ORAL | 0 refills | Status: DC

## 2021-06-04 MED ORDER — ONDANSETRON 4 MG PO TBDP: ORAL_TABLET | ORAL | 0 refills | Status: DC

## 2021-06-04 NOTE — Discharge Instructions (Signed)
You have flu and are expected to have cough and fever for several days.  You can take Tamiflu as prescribed.  If you nauseated you may take Zofran with it  Take potassium for 3 days to increase your potassium.  Repeat potassium level in a week  See your doctor for follow-up  Return to ER if you have worse cough, fever, vomiting, dehydration

## 2021-06-04 NOTE — ED Provider Notes (Signed)
[facility] EMERGENCY DEPARTMENT Provider Note   CSN: [id number] Arrival date & time: 06/04/21  1135     History Chief Complaint  Patient presents with   Shortness of Breath    Jodi Kim is a 65 y.o. female history of diabetes, asthma, hypertension here presenting with shortness of breath and cough.  Patient has shortness of breath and fever and nonproductive cough for the last 2 days.  Patient has myalgias as well.  Denies any sick contacts.  Denies any vomiting.  The history is provided by the patient.      Past Medical History:  Diagnosis Date   Acid reflux    Asthma    Diabetes mellitus without complication (HCC)    Hypertension     There are no problems to display for this patient.   Past Surgical History:  Procedure Laterality Date   CESAREAN SECTION       OB History   No obstetric history on file.     No family history on file.  Social History   Tobacco Use   Smoking status: Some Days    Types: Cigarettes  Substance Use Topics   Alcohol use: Yes    Comment: Twice a week. Last drink: PTA. Beer 40oz and some liqour   Drug use: Yes    Types: Marijuana, Cocaine    Comment: Last used: Earlier tonight    Home Medications Prior to Admission medications   Medication Sig Start Date End Date Taking? Authorizing Provider  albuterol (PROVENTIL HFA;VENTOLIN HFA) 108 (90 BASE) MCG/ACT inhaler Inhale 2 puffs into the lungs every 4 (four) hours as needed for wheezing or shortness of breath.    [provider]  doxazosin (CARDURA) 1 MG tablet Take 1 mg by mouth at bedtime.    [provider]  doxycycline (VIBRAMYCIN) 100 MG capsule Take 1 capsule (100 mg total) by mouth 2 (two) times daily. Patient not taking: Reported on 09/24/2014 07/24/12   Fayrene Helper, PA-C  DULoxetine HCl (CYMBALTA PO) Take 1 capsule by mouth daily.    [provider]  guaiFENesin (ROBITUSSIN) 100 MG/5ML liquid Take 5-10 mLs (100-200 mg total) by  mouth every 4 (four) hours as needed for cough. Patient not taking: Reported on 09/24/2014 07/24/12   Fayrene Helper, PA-C  HYDROcodone-acetaminophen (LORTAB) 7.5-500 MG/15ML solution Take 15 mLs by mouth every 6 (six) hours as needed for pain or cough. Patient not taking: Reported on 09/24/2014 07/24/12   Fayrene Helper, PA-C  levothyroxine (SYNTHROID, LEVOTHROID) 100 MCG tablet Take 100 mcg by mouth daily.    [provider]  metFORMIN (GLUCOPHAGE) 1000 MG tablet Take 1,000 mg by mouth 2 (two) times daily with a meal.    [provider]  omeprazole (PRILOSEC) 40 MG capsule Take 40 mg by mouth daily.    [provider]  QUEtiapine (SEROQUEL) 300 MG tablet Take 300 mg by mouth 2 (two) times daily.     [provider]    Allergies    Iodinated diagnostic agents  Review of Systems   Review of Systems  Constitutional:  Positive for fever.  Respiratory:  Positive for cough and shortness of breath.   All other systems reviewed and are negative.  Physical Exam Updated Vital Signs BP (!) 154/103 (BP Location: Left Arm)   Pulse 96   Temp 98.3 F (36.8 C) (Oral)   Resp (!) 26   Ht 5\' 4"  (1.626 m)   Wt 91.6 kg   SpO2 100%  BMI 34.67 kg/m   Physical Exam Vitals and nursing note reviewed.  Constitutional:      Comments: Coughing but appears comfortable   HENT:     Head: Normocephalic.     Mouth/Throat:     Mouth: Mucous membranes are moist.  Eyes:     Extraocular Movements: Extraocular movements intact.     Pupils: Pupils are equal, round, and reactive to light.  Cardiovascular:     Rate and Rhythm: Normal rate and regular rhythm.  Pulmonary:     Effort: Pulmonary effort is normal.     Breath sounds: Normal breath sounds.  Abdominal:     Palpations: Abdomen is soft.  Musculoskeletal:        General: Normal range of motion.     Cervical back: Normal range of motion and neck supple.  Skin:    General: Skin is warm.     Capillary Refill: Capillary  refill takes less than 2 seconds.  Neurological:     General: No focal deficit present.     Mental Status: She is alert and oriented to person, place, and time.  Psychiatric:        Mood and Affect: Mood normal.        Behavior: Behavior normal.    ED Results / Procedures / Treatments   Labs (all labs ordered are listed, but only abnormal results are displayed) Labs Reviewed  RESP PANEL BY RT-PCR (FLU A&B, COVID) ARPGX2 - Abnormal; Notable for the following components:      Result Value   Influenza A by PCR POSITIVE (*)    All other components within normal limits  BASIC METABOLIC PANEL - Abnormal; Notable for the following components:   Potassium 3.0 (*)    CO2 21 (*)    Glucose, Bld 288 (*)    Calcium 8.1 (*)    All other components within normal limits  CBC - Abnormal; Notable for the following components:   MCV 104.0 (*)    All other components within normal limits  TROPONIN I (HIGH SENSITIVITY)  TROPONIN I (HIGH SENSITIVITY)    EKG EKG Interpretation  Date/Time:  Thursday June 04 2021 11:42:23 EDT Ventricular Rate:  98 PR Interval:  126 QRS Duration: 78 QT Interval:  348 QTC Calculation: 444 R Axis:   64 Text Interpretation: Normal sinus rhythm Nonspecific ST and T wave abnormality Abnormal ECG No significant change since last tracing Confirmed by Richardean Canal [phone]) on 06/04/2021 3:27:03 PM  Radiology DG Chest Portable 1 View  Result Date: 06/04/2021 CLINICAL DATA:  Chest pain, shortness of breath, coughing for 2 days. EXAM: PORTABLE CHEST 1 VIEW COMPARISON:  Chest radiograph dated 09/24/2014. FINDINGS: The heart size and mediastinal contours are within normal limits. Both lungs are clear. Degenerative changes are seen in the spine. IMPRESSION: No active disease. Electronically Signed   By: Romona Curls M.D.   On: 06/04/2021 12:22    Procedures Procedures   Medications Ordered in ED Medications  oseltamivir (TAMIFLU) capsule 75 mg (has no administration  in time range)  albuterol (VENTOLIN HFA) 108 (90 Base) MCG/ACT inhaler 2 puff (has no administration in time range)    ED Course  I have reviewed the triage vital signs and the nursing notes.  Pertinent labs & imaging results that were available during my care of the patient were reviewed by me and considered in my medical decision making (see chart for details).    MDM Rules/Calculators/A&P  Jodi Kim is a 65 y.o. female here presenting with cough and chills for 2 days.  Patient is well-appearing.  She is not hypoxic.  Patient's COVID test is negative but her flu test is positive.  Potassium is 3.0 and supplemented.  Chest x-ray is clear.  Patient has no oxygen requirement.  I think her symptoms are likely from flu.  She is within the window for Tamiflu and I discussed risks and benefits for the medication.  Stable for discharge and gave strict return precautions.    Final Clinical Impression(s) / ED Diagnoses Final diagnoses:  None    Rx / DC Orders ED Discharge Orders     None        Charlynne Pander, MD 06/04/21 1554

## 2021-06-04 NOTE — ED Triage Notes (Signed)
Pt c/o chest pain, SOB, nausea and coughing x 2 days. Hx of asthma.

## 2021-11-03 ENCOUNTER — Other Ambulatory Visit: Payer: Self-pay

## 2021-11-03 ENCOUNTER — Emergency Department (HOSPITAL_COMMUNITY)
Admission: EM | Admit: 2021-11-03 | Discharge: 2021-11-04 | Discharge status: Against Medical Advice | Payer: Medicaid Other | Attending: Emergency Medicine | Admitting: Emergency Medicine

## 2021-11-03 ENCOUNTER — Encounter (HOSPITAL_COMMUNITY): Payer: Self-pay | Admitting: Emergency Medicine

## 2021-11-03 DIAGNOSIS — Z5321 Procedure and treatment not carried out due to patient leaving prior to being seen by health care provider: Secondary | ICD-10-CM | POA: Diagnosis not present

## 2021-11-03 DIAGNOSIS — M79602 Pain in left arm: Secondary | ICD-10-CM | POA: Insufficient documentation

## 2021-11-03 DIAGNOSIS — R200 Anesthesia of skin: Secondary | ICD-10-CM | POA: Diagnosis not present

## 2021-11-03 NOTE — ED Notes (Signed)
Patient stated she did not want to wait any longer and left. 

## 2021-11-03 NOTE — ED Triage Notes (Signed)
Pt. Stated, Im having left arm and left hand, thum numbness for 2 days.Denies any injury, my eyes are also blurry this started a month ago. ?

## 2021-11-03 NOTE — ED Provider Triage Note (Signed)
Emergency Medicine Provider Triage Evaluation Note ? ?Jodi Kim , a 66 y.o. female  was evaluated in triage.  Pt complains of left arm pain. She states that her left wrist on the radial side started hurting yesterday. It feels like a shooting pain that goes midway up her left forearm. ? ?She also complains of a rash that appeared three days ago. There are two spots on the left and right side of her back posterior to her axillary region. ? ?Review of Systems  ?Positive:  ?Negative:  ? ?Physical Exam  ?BP (!) 146/84 (BP Location: Right Arm)   Pulse 81   Temp 98.3 ?F (36.8 ?C) (Oral)   Resp 17   SpO2 95%  ?Gen:   Awake, no distress   ?Resp:  Normal effort  ?MSK:   Moves extremities without difficulty  ?Other:  Rash raised, appears erythematous ? ?No deformity or ttp to left wrist. ? ?Medical Decision Making  ?Medically screening exam initiated at 2:12 PM.  Appropriate orders placed.  Jodi Kim was informed that the remainder of the evaluation will be completed by another provider, this initial triage assessment does not replace that evaluation, and the importance of remaining in the ED until their evaluation is complete. ? ? ?  ?Claudie Leach, PA-C ?11/03/21 1414 ? ?

## 2021-12-14 ENCOUNTER — Emergency Department (HOSPITAL_BASED_OUTPATIENT_CLINIC_OR_DEPARTMENT_OTHER)
Admission: EM | Admit: 2021-12-14 | Discharge: 2021-12-14 | Disposition: A | Discharge status: Home / Self Care | Payer: Medicaid Other | Attending: Emergency Medicine | Admitting: Emergency Medicine

## 2021-12-14 ENCOUNTER — Other Ambulatory Visit (HOSPITAL_BASED_OUTPATIENT_CLINIC_OR_DEPARTMENT_OTHER): Payer: Self-pay

## 2021-12-14 ENCOUNTER — Emergency Department (HOSPITAL_BASED_OUTPATIENT_CLINIC_OR_DEPARTMENT_OTHER): Payer: Medicaid Other

## 2021-12-14 ENCOUNTER — Other Ambulatory Visit: Payer: Self-pay

## 2021-12-14 DIAGNOSIS — E119 Type 2 diabetes mellitus without complications: Secondary | ICD-10-CM | POA: Diagnosis not present

## 2021-12-14 DIAGNOSIS — J45909 Unspecified asthma, uncomplicated: Secondary | ICD-10-CM | POA: Diagnosis not present

## 2021-12-14 DIAGNOSIS — Z79899 Other long term (current) drug therapy: Secondary | ICD-10-CM | POA: Insufficient documentation

## 2021-12-14 DIAGNOSIS — Z7984 Long term (current) use of oral hypoglycemic drugs: Secondary | ICD-10-CM | POA: Diagnosis not present

## 2021-12-14 DIAGNOSIS — M654 Radial styloid tenosynovitis [de Quervain]: Secondary | ICD-10-CM | POA: Insufficient documentation

## 2021-12-14 DIAGNOSIS — M25532 Pain in left wrist: Secondary | ICD-10-CM | POA: Diagnosis present

## 2021-12-14 DIAGNOSIS — I10 Essential (primary) hypertension: Secondary | ICD-10-CM | POA: Diagnosis not present

## 2021-12-14 MED ORDER — HYDROCODONE-ACETAMINOPHEN 5-325 MG PO TABS
1.0000 | ORAL_TABLET | Freq: Four times a day (QID) | ORAL | 0 refills | Status: DC | PRN
  Filled 2021-12-14: qty 14, 4d supply, fill #0

## 2021-12-14 NOTE — Discharge Instructions (Addendum)
Make an appointment to follow-up with sports medicine.  Keep the splint on at all times can remove to shower.  Return for any new or worse symptoms.  Take the pain medicine as directed. ?

## 2021-12-14 NOTE — ED Provider Notes (Signed)
?[facility] EMERGENCY DEPARTMENT ?Provider Note ? ? ?CSN: [id number] ?Arrival date & time: 12/14/21  1001 ? ?  ? ?History ? ?Chief Complaint  ?Patient presents with  ? Wrist Pain  ? ? ?Jodi Kim is a 66 y.o. female. ? ?Patient with complaint of pain to the left wrist and base of the left thumb for 3 weeks.  Is been getting worse.  No fall or injury.  No history of gout.  Patient has been taking Tylenol and aspirin without any improvement.  Has not seen anybody else for this. ? ?Past medical history significant for hypertension diabetes acid reflux and asthma.  Past surgical history significant for cesarean section. ? ? ?  ? ?Home Medications ?Prior to Admission medications   ?Medication Sig Start Date End Date Taking? Authorizing Provider  ?albuterol (PROVENTIL HFA;VENTOLIN HFA) 108 (90 BASE) MCG/ACT inhaler Inhale 2 puffs into the lungs every 4 (four) hours as needed for wheezing or shortness of breath.    [provider]  ?doxazosin (CARDURA) 1 MG tablet Take 1 mg by mouth at bedtime.    [provider]  ?doxycycline (VIBRAMYCIN) 100 MG capsule Take 1 capsule (100 mg total) by mouth 2 (two) times daily. ?Patient not taking: Reported on 09/24/2014 07/24/12   Domenic Moras, PA-C  ?DULoxetine HCl (CYMBALTA PO) Take 1 capsule by mouth daily.    [provider]  ?guaiFENesin (ROBITUSSIN) 100 MG/5ML liquid Take 5-10 mLs (100-200 mg total) by mouth every 4 (four) hours as needed for cough. ?Patient not taking: Reported on 09/24/2014 07/24/12   Domenic Moras, PA-C  ?HYDROcodone-acetaminophen (LORTAB) 7.5-500 MG/15ML solution Take 15 mLs by mouth every 6 (six) hours as needed for pain or cough. ?Patient not taking: Reported on 09/24/2014 07/24/12   Domenic Moras, PA-C  ?levothyroxine (SYNTHROID, LEVOTHROID) 100 MCG tablet Take 100 mcg by mouth daily.    [provider]  ?metFORMIN (GLUCOPHAGE) 1000 MG tablet Take 1,000 mg by mouth 2 (two) times daily with a meal.    [provider]  ?omeprazole (PRILOSEC) 40 MG capsule Take 40 mg by mouth daily.    [provider]  ?ondansetron (ZOFRAN ODT) 4 MG disintegrating tablet 4mg  ODT q4 hours prn nausea/vomit 06/04/21   Drenda Freeze, MD  ?oseltamivir (TAMIFLU) 75 MG capsule Take 1 capsule (75 mg total) by mouth every 12 (twelve) hours. 06/04/21   Drenda Freeze, MD  ?potassium chloride SA (KLOR-CON) 20 MEQ tablet Take 1 tablet (20 mEq total) by mouth daily. 06/04/21   Drenda Freeze, MD  ?QUEtiapine (SEROQUEL) 300 MG tablet Take 300 mg by mouth 2 (two) times daily.     [provider]  ?   ? ?Allergies    ?Iodinated contrast media   ? ?Review of Systems   ?Review of Systems  ?Constitutional:  Negative for chills and fever.  ?HENT:  Negative for ear pain and sore throat.   ?Eyes:  Negative for pain and visual disturbance.  ?Respiratory:  Negative for cough and shortness of breath.   ?Cardiovascular:  Negative for chest pain and palpitations.  ?Gastrointestinal:  Negative for abdominal pain and vomiting.  ?Genitourinary:  Negative for dysuria and hematuria.  ?Musculoskeletal:  Positive for joint swelling. Negative for arthralgias and back pain.  ?Skin:  Negative for color change and rash.  ?Neurological:  Negative for seizures and syncope.  ?All other systems reviewed and are negative. ? ?Physical Exam ?Updated Vital Signs ?BP (!) 172/78 (BP Location: Right Arm)  Pulse 65   Temp 97.7 ?F (36.5 ?C) (Oral)   Resp 18   Ht 1.626 m (5\' 4" )   Wt 98.4 kg   SpO2 100%   BMI 37.25 kg/m?  ?Physical Exam ?Vitals and nursing note reviewed.  ?Constitutional:   ?   General: She is not in acute distress. ?   Appearance: Normal appearance. She is well-developed.  ?HENT:  ?   Head: Normocephalic and atraumatic.  ?Eyes:  ?   Extraocular Movements: Extraocular movements intact.  ?   Conjunctiva/sclera: Conjunctivae normal.  ?   Pupils: Pupils are equal, round, and reactive to light.  ?Cardiovascular:  ?   Rate and  Rhythm: Normal rate and regular rhythm.  ?   Heart sounds: No murmur heard. ?Pulmonary:  ?   Effort: Pulmonary effort is normal. No respiratory distress.  ?   Breath sounds: Normal breath sounds.  ?Abdominal:  ?   Palpations: Abdomen is soft.  ?   Tenderness: There is no abdominal tenderness.  ?Musculoskeletal:     ?   General: Swelling and tenderness present.  ?   Cervical back: Normal range of motion and neck supple.  ?   Comments: Left radial pulses 2+.  And some increased warmth and swelling kind of at the snuffbox area and at the base of the thumb.  Tenderness to palpation.  Good cap refill to the thumb and the fingers sensation intact.  Good movement at the elbow and shoulder.  And no pain with movement of the fingers other than extension of the thumb.  No tenderness to palpation thumping of the extensor tendon of the thumb.  However there is pain with flexion of the thumb and wrist towards the ulnar side.  ?Skin: ?   General: Skin is warm and dry.  ?   Capillary Refill: Capillary refill takes less than 2 seconds.  ?Neurological:  ?   General: No focal deficit present.  ?   Mental Status: She is alert and oriented to person, place, and time.  ?   Cranial Nerves: No cranial nerve deficit.  ?   Sensory: No sensory deficit.  ?Psychiatric:     ?   Mood and Affect: Mood normal.  ? ? ?ED Results / Procedures / Treatments   ?Labs ?(all labs ordered are listed, but only abnormal results are displayed) ?Labs Reviewed - No data to display ? ?EKG ?None ? ?Radiology ?DG Wrist Complete Left ? ?Result Date: 12/14/2021 ?CLINICAL DATA:  Left wrist and base of thumb pain for 3 weeks. No known injury. EXAM: LEFT WRIST - COMPLETE 3+ VIEW COMPARISON:  None Available. FINDINGS: Minimal ulnar positive variance. Minimal peripheral degenerative spurring at the distal radioulnar joint. Mild triscaphe and moderate thumb carpometacarpal joint space narrowing osteoarthritis. 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 No acute fracture. Electronically Signed   By: Yvonne Kendall M.D.   On: 12/14/2021 10:50   ? ?Procedures ?Procedures  ? ? ?Medications Ordered in ED ?Medications - No data to display ? ?ED Course/ Medical Decision Making/ A&P ?  ?                        ?Medical Decision Making ?Amount and/or Complexity of Data Reviewed ?Radiology: ordered. ? ? ?We will get x-ray of the left wrist.  No tenderness to palpation or thumping of the extensor tendon of the left thumb so I do not think this is  de Quervains tenosynovitis.  However the pain is in the right location and radiating to the right area.  The [place] test does seem to produce some pain. ? ?We will get x-ray and then probably treat with a splint probably Velcro splint and follow-up with sports medicine. ? ?X-ray shows mild osteoarthritis of the thumb and carpal metacarpal joint and distal radial ulnar joint.  No acute fractures.  We will treat with a Velcro splint.  Have her follow-up with sports medicine.  Also treat with pain medication. ? ? ?Final Clinical Impression(s) / ED Diagnoses ?Final diagnoses:  ?Left wrist pain  ?De Quervain's tenosynovitis, left  ? ? ?Rx / DC Orders ?ED Discharge Orders   ? ? None  ? ?  ? ? ?  ?Fredia Sorrow, MD ?12/14/21 1129 ? ?

## 2021-12-14 NOTE — ED Triage Notes (Signed)
Pt c/o intermittent left wrist pain radiating to thumb x 3 weeks worse with movement. Denies injury. Denies chest pain.  ?

## 2021-12-21 ENCOUNTER — Ambulatory Visit: Payer: Medicaid Other | Admitting: Podiatrist

## 2021-12-21 ENCOUNTER — Emergency Department (HOSPITAL_COMMUNITY)
Admission: EM | Admit: 2021-12-21 | Discharge: 2021-12-21 | Disposition: A | Discharge status: Home / Self Care | Payer: Medicaid Other | Attending: Emergency Medicine | Admitting: Emergency Medicine

## 2021-12-21 ENCOUNTER — Emergency Department (HOSPITAL_BASED_OUTPATIENT_CLINIC_OR_DEPARTMENT_OTHER)
Admit: 2021-12-21 | Discharge: 2021-12-21 | Disposition: A | Discharge status: Home / Self Care | Payer: Medicaid Other | Attending: Emergency Medicine | Admitting: Emergency Medicine

## 2021-12-21 ENCOUNTER — Encounter (HOSPITAL_COMMUNITY): Payer: Self-pay

## 2021-12-21 ENCOUNTER — Other Ambulatory Visit: Payer: Self-pay

## 2021-12-21 ENCOUNTER — Emergency Department (HOSPITAL_COMMUNITY): Payer: Medicaid Other

## 2021-12-21 DIAGNOSIS — I10 Essential (primary) hypertension: Secondary | ICD-10-CM | POA: Diagnosis not present

## 2021-12-21 DIAGNOSIS — M25562 Pain in left knee: Secondary | ICD-10-CM

## 2021-12-21 DIAGNOSIS — M25462 Effusion, left knee: Secondary | ICD-10-CM | POA: Diagnosis not present

## 2021-12-21 DIAGNOSIS — Z7984 Long term (current) use of oral hypoglycemic drugs: Secondary | ICD-10-CM | POA: Insufficient documentation

## 2021-12-21 DIAGNOSIS — E119 Type 2 diabetes mellitus without complications: Secondary | ICD-10-CM | POA: Diagnosis not present

## 2021-12-21 LAB — BASIC METABOLIC PANEL
Anion gap: 8 (ref 5–15)
BUN: 5 mg/dL — ABNORMAL LOW (ref 8–23)
CO2: 24 mmol/L (ref 22–32)
Calcium: 8.5 mg/dL — ABNORMAL LOW (ref 8.9–10.3)
Chloride: 108 mmol/L (ref 98–111)
Creatinine, Ser: 0.47 mg/dL (ref 0.44–1.00)
GFR, Estimated: >60 mL/min (ref >60)
Glucose, Bld: 110 mg/dL — ABNORMAL HIGH (ref 70–99)
Potassium: 3.5 mmol/L (ref 3.5–5.1)
Sodium: 140 mmol/L (ref 135–145)

## 2021-12-21 LAB — SYNOVIAL CELL COUNT + DIFF, W/ CRYSTALS
Crystals, Fluid: NONE SEEN
Eosinophils-Synovial: 0 % (ref 0–1)
Lymphocytes-Synovial Fld: 1 % (ref 0–20)
Monocyte-Macrophage-Synovial Fluid: 50 % (ref 50–90)
Neutrophil, Synovial: 49 % — ABNORMAL HIGH (ref 0–25)
WBC, Synovial: 1905 /mm3 — ABNORMAL HIGH (ref 0–200)

## 2021-12-21 LAB — CBC WITH DIFFERENTIAL/PLATELET
Abs Immature Granulocytes: 0.01 10*3/uL (ref 0.00–0.07)
Basophils Absolute: 0 10*3/uL (ref 0.0–0.1)
Basophils Relative: 0 %
Eosinophils Absolute: 0 10*3/uL (ref 0.0–0.5)
Eosinophils Relative: 1 %
HCT: 39.5 % (ref 36.0–46.0)
Hemoglobin: 12.8 g/dL (ref 12.0–15.0)
Immature Granulocytes: 0 %
Lymphocytes Relative: 20 %
Lymphs Abs: 1.1 10*3/uL (ref 0.7–4.0)
MCH: 32.2 pg (ref 26.0–34.0)
MCHC: 32.4 g/dL (ref 30.0–36.0)
MCV: 99.2 fL (ref 80.0–100.0)
Monocytes Absolute: 0.3 10*3/uL (ref 0.1–1.0)
Monocytes Relative: 6 %
Neutro Abs: 3.8 10*3/uL (ref 1.7–7.7)
Neutrophils Relative %: 73 %
Platelets: 167 10*3/uL (ref 150–400)
RBC: 3.98 MIL/uL (ref 3.87–5.11)
RDW: 13.1 % (ref 11.5–15.5)
WBC: 5.3 10*3/uL (ref 4.0–10.5)
nRBC: 0 % (ref 0.0–0.2)

## 2021-12-21 LAB — C-REACTIVE PROTEIN: CRP: 0.7 mg/dL (ref <1.0)

## 2021-12-21 LAB — SEDIMENTATION RATE: Sed Rate: 19 mm/hr (ref 0–22)

## 2021-12-21 LAB — CBG MONITORING, ED: Glucose-Capillary: 103 mg/dL — ABNORMAL HIGH (ref 70–99)

## 2021-12-21 LAB — URIC ACID: Uric Acid, Serum: 5.1 mg/dL (ref 2.5–7.1)

## 2021-12-21 MED ORDER — HYDROCODONE-ACETAMINOPHEN 5-325 MG PO TABS
2.0000 | ORAL_TABLET | Freq: Once | ORAL | Status: AC
  Administered 2021-12-21: 2 via ORAL
  Filled 2021-12-21: qty 2

## 2021-12-21 MED ORDER — LIDOCAINE HCL (PF) 1 % IJ SOLN
5.0000 mL | Freq: Once | INTRAMUSCULAR | Status: AC
  Administered 2021-12-21: 5 mL via INTRADERMAL
  Filled 2021-12-21: qty 5

## 2021-12-21 MED ORDER — POVIDONE-IODINE 5 % EX SOLN
Freq: Once | CUTANEOUS | Status: DC
  Filled 2021-12-21: qty 88.7

## 2021-12-21 MED ORDER — MELOXICAM 7.5 MG PO TABS
7.5000 mg | ORAL_TABLET | Freq: Every day | ORAL | 0 refills | Status: DC
  Filled 2021-12-21: qty 10, 10d supply, fill #0

## 2021-12-21 MED ORDER — POVIDONE-IODINE (APLICARE-BETADINE) 10% STERILE
22.5000 mL | Freq: Once | CUTANEOUS | Status: AC
  Administered 2021-12-21: 22.5 mL via TOPICAL
  Filled 2021-12-21: qty 1

## 2021-12-21 NOTE — Progress Notes (Signed)
Orthopedic Tech Progress Note ?Patient Details:  ?Jodi Kim ?1955/09/27 ?[id number] ? ?Ortho Devices ?Type of Ortho Device: Ace wrap ?Ortho Device/Splint Location: lle knee ?Ortho Device/Splint Interventions: Ordered, Application, Adjustment ? Rn said patient already had crutches ?Post Interventions ?Patient Tolerated: Well ?Instructions Provided: Care of device, Adjustment of device ? ?Trinna Post ?12/21/2021, 10:17 PM ? ?

## 2021-12-21 NOTE — ED Notes (Signed)
Patient transported to vascular. 

## 2021-12-21 NOTE — Discharge Instructions (Signed)
Your testing shows a large fluid collection in your knee called an effusion.  This is likely secondary to inflammation or arthritis and less likely infection.  You should follow-up with the orthopedic doctor this week and take the anti-inflammatories as prescribed.  You declined steroids.  Return to the ED with worsening pain, fever, vomiting, unable to bend the knee or any other concerns ?

## 2021-12-21 NOTE — ED Provider Triage Note (Signed)
Emergency Medicine Provider Triage Evaluation Note ? ?Jodi Kim , a 66 y.o. female  was evaluated in triage.  Pt complains of 3 days of left knee swelling and pain.  History of diabetes, controlled blood sugars.  No known injury.  Now walking with a crutch ? ?Review of Systems  ?Positive: Leg pain, difficulty walking ?Negative:  ? ?Physical Exam  ?There were no vitals taken for this visit. ?Gen:   Awake, no distress   ?Resp:  Normal effort  ?MSK:   Moves extremities without difficulty  ?Other:  Tenderness over tibial tuberosity of left lower extremity.  Swelling apparent in patient's chains, not undressed in triage ? ?Medical Decision Making  ?Medically screening exam initiated at 11:26 AM.  Appropriate orders placed.  Jodi Kim was informed that the remainder of the evaluation will be completed by another provider, this initial triage assessment does not replace that evaluation, and the importance of remaining in the ED until their evaluation is complete. ? ? ?  ?Rhae Hammock, PA-C ?12/21/21 1127 ? ?

## 2021-12-21 NOTE — ED Provider Notes (Signed)
?[facility] ?Provider Note ? ? ?CSN: [id number] ?Arrival date & time: 12/21/21  1043 ? ?  ? ?History ? ?Chief Complaint  ?Patient presents with  ? Leg Swelling  ? Leg Pain  ? ? ?Jodi Kim is a 66 y.o. female. ? ?Patient with history of HTN, DM, presenting with 3-4 days of progressively worsening L knee pain. Denies fall or trauma. No fever.  No previous issues with this knee.  Was recently seen for arthritis in her left wrist.  No history of gout.  Does have history of arthritis.  Does have diabetes and sugars are well controlled.  No known injury.  No blood thinner use.  Pain with range of motion of the left knee.  No focal numbness, tingling, weakness. ? ?The history is provided by the patient.  ?Leg Pain ?Associated symptoms: no fever   ? ?  ? ?Home Medications ?Prior to Admission medications   ?Medication Sig Start Date End Date Taking? Authorizing Provider  ?albuterol (PROVENTIL HFA;VENTOLIN HFA) 108 (90 BASE) MCG/ACT inhaler Inhale 2 puffs into the lungs every 4 (four) hours as needed for wheezing or shortness of breath.    [provider]  ?doxazosin (CARDURA) 1 MG tablet Take 1 mg by mouth at bedtime.    [provider]  ?doxycycline (VIBRAMYCIN) 100 MG capsule Take 1 capsule (100 mg total) by mouth 2 (two) times daily. ?Patient not taking: Reported on 09/24/2014 07/24/12   Domenic Moras, PA-C  ?DULoxetine HCl (CYMBALTA PO) Take 1 capsule by mouth daily.    [provider]  ?guaiFENesin (ROBITUSSIN) 100 MG/5ML liquid Take 5-10 mLs (100-200 mg total) by mouth every 4 (four) hours as needed for cough. ?Patient not taking: Reported on 09/24/2014 07/24/12   Domenic Moras, PA-C  ?HYDROcodone-acetaminophen (LORTAB) 7.5-500 MG/15ML solution Take 15 mLs by mouth every 6 (six) hours as needed for pain or cough. ?Patient not taking: Reported on 09/24/2014 07/24/12   Domenic Moras, PA-C  ?HYDROcodone-acetaminophen (NORCO/VICODIN) 5-325 MG tablet Take 1 tablet  by mouth every 6 (six) hours as needed for moderate pain. 12/14/21   Fredia Sorrow, MD  ?levothyroxine (SYNTHROID, LEVOTHROID) 100 MCG tablet Take 100 mcg by mouth daily.    [provider]  ?metFORMIN (GLUCOPHAGE) 1000 MG tablet Take 1,000 mg by mouth 2 (two) times daily with a meal.    [provider]  ?omeprazole (PRILOSEC) 40 MG capsule Take 40 mg by mouth daily.    [provider]  ?ondansetron (ZOFRAN ODT) 4 MG disintegrating tablet 4mg  ODT q4 hours prn nausea/vomit 06/04/21   Drenda Freeze, MD  ?oseltamivir (TAMIFLU) 75 MG capsule Take 1 capsule (75 mg total) by mouth every 12 (twelve) hours. 06/04/21   Drenda Freeze, MD  ?potassium chloride SA (KLOR-CON) 20 MEQ tablet Take 1 tablet (20 mEq total) by mouth daily. 06/04/21   Drenda Freeze, MD  ?QUEtiapine (SEROQUEL) 300 MG tablet Take 300 mg by mouth 2 (two) times daily.     [provider]  ?   ? ?Allergies    ?Iodinated contrast media   ? ?Review of Systems   ?Review of Systems  ?Constitutional:  Negative for activity change, appetite change and fever.  ?HENT:  Negative for congestion.   ?Respiratory:  Negative for cough, chest tightness and shortness of breath.   ?Cardiovascular:  Negative for chest pain.  ?Gastrointestinal:  Negative for abdominal pain (.ro), nausea and vomiting.  ?Genitourinary:  Negative for dysuria and hematuria.  ?  Musculoskeletal:  Positive for arthralgias and myalgias.  ?Skin:  Negative for wound.  ?Neurological:  Negative for dizziness, weakness and headaches.  ? ? all other systems are negative except as noted in the HPI and PMH.  ? ?Physical Exam ?Updated Vital Signs ?BP (!) 184/95 (BP Location: Right Arm)   Pulse 81   Temp 98.1 ?F (36.7 ?C) (Oral)   Resp 18   Ht 5\' 4"  (1.626 m)   Wt 87 kg   SpO2 98%   BMI 32.92 kg/m?  ?Physical Exam ?Vitals and nursing note reviewed.  ?Constitutional:   ?   General: She is not in acute distress. ?   Appearance: She is well-developed.   ?HENT:  ?   Head: Normocephalic and atraumatic.  ?   Mouth/Throat:  ?   Pharynx: No oropharyngeal exudate.  ?Eyes:  ?   Conjunctiva/sclera: Conjunctivae normal.  ?   Pupils: Pupils are equal, round, and reactive to light.  ?Neck:  ?   Comments: No meningismus. ?Cardiovascular:  ?   Rate and Rhythm: Normal rate and regular rhythm.  ?   Heart sounds: Normal heart sounds. No murmur heard. ?Pulmonary:  ?   Effort: Pulmonary effort is normal. No respiratory distress.  ?   Breath sounds: Normal breath sounds.  ?Abdominal:  ?   Palpations: Abdomen is soft.  ?   Tenderness: There is no abdominal tenderness. There is no guarding or rebound.  ?Musculoskeletal:     ?   General: Swelling and tenderness present.  ?   Cervical back: Normal range of motion and neck supple.  ?   Left lower leg: Edema present.  ?   Comments: Large left knee effusion.  Mild warmth.  No redness. ?Does not want to flex or extend due to pain.  Intact DP and PT pulses.  Compartments are soft  ?Skin: ?   General: Skin is warm.  ?Neurological:  ?   Mental Status: She is alert and oriented to person, place, and time.  ?   Cranial Nerves: No cranial nerve deficit.  ?   Motor: No abnormal muscle tone.  ?   Coordination: Coordination normal.  ?   Comments:  5/5 strength throughout. CN 2-12 intact.Equal grip strength.   ?Psychiatric:     ?   Behavior: Behavior normal.  ? ? ?ED Results / Procedures / Treatments   ?Labs ?(all labs ordered are listed, but only abnormal results are displayed) ?Labs Reviewed  ?BASIC METABOLIC PANEL - Abnormal; Notable for the following components:  ?    Result Value  ? Glucose, Bld 110 (*)   ? BUN 5 (*)   ? Calcium 8.5 (*)   ? All other components within normal limits  ?SYNOVIAL CELL COUNT + DIFF, W/ CRYSTALS - Abnormal; Notable for the following components:  ? Color, Synovial ORANGE (*)   ? Appearance-Synovial CLOUDY (*)   ? WBC, Synovial 1,905 (*)   ? Neutrophil, Synovial 49 (*)   ? All other components within normal limits  ?CBG  MONITORING, ED - Abnormal; Notable for the following components:  ? Glucose-Capillary 103 (*)   ? All other components within normal limits  ?BODY FLUID CULTURE W GRAM STAIN  ?CBC WITH DIFFERENTIAL/PLATELET  ?SEDIMENTATION RATE  ?C-REACTIVE PROTEIN  ?URIC ACID  ?GLUCOSE, BODY FLUID OTHER            ? ? ?EKG ?None ? ?Radiology ?DG Knee Complete 4 Views Left ? ?Result Date: 12/21/2021 ?CLINICAL DATA:  Fall, knee  pain EXAM: LEFT KNEE - COMPLETE 4+ VIEW COMPARISON:  Left knee x-ray 04/07/2021 FINDINGS: No acute fracture or dislocation identified. Moderate tricompartmental joint space narrowing with marginal osteophytes. Large joint effusion. IMPRESSION: 1. Large joint effusion. 2. No acute osseous abnormality identified.  Degenerative changes. Electronically Signed   By: Ofilia Neas M.D.   On: 12/21/2021 12:17   ? ?Procedures ?Procedures  ? ? ?Medications Ordered in ED ?Medications  ?HYDROcodone-acetaminophen (NORCO/VICODIN) 5-325 MG per tablet 2 tablet (has no administration in time range)  ?povidone-Iodine (BETADINE) 5 % topical solution (has no administration in time range)  ? ? ?ED Course/ Medical Decision Making/ A&P ?  ?                        ?Medical Decision Making ?Amount and/or Complexity of Data Reviewed ?Labs: ordered. ?Radiology: ordered and independent interpretation performed. Decision-making details documented in ED Course. ?ECG/medicine tests: ordered and independent interpretation performed. Decision-making details documented in ED Course. ? ?Risk ?OTC drugs. ?Prescription drug management. ? ?Atraumatic left knee pain.  Large effusion on x-ray without fracture. ?Some warmth, no erythema.  ? ?X-ray reviewed and interpreted by me.  ? ?Venous duplex is negative for DVT.  Patient has no fever or leukocytosis ? ?Arthrocentesis performed by Dr. Royce Macadamia to evaluate for septic joint.  No crystals or organisms seen. ? ?Synovial fluid not consistent with septic arthritis.  Discussed with Dr. Stann Mainland of  orthopedics who agrees.  Recommends anti-inflammatories and possibly steroids. ? ?Patient given Ace wrap as well as crutches.  Follow-up with orthopedics in the office this week. ?She declined steroids given her his

## 2021-12-21 NOTE — ED Triage Notes (Signed)
Pt arrived POV c/o left leg swelling and pain that started Thursday when she was out walking. Pt stated her leg locked up and has been swollen and painful ever since. Pt states she cannot walk on it.  ?

## 2021-12-21 NOTE — ED Provider Notes (Signed)
.  Joint Aspiration/Arthrocentesis ? ?Date/Time: 12/21/2021 9:25 PM ?Performed by: Laurence Compton, MD ?Authorized by: Glynn Octave, MD  ? ?Consent:  ?  Consent obtained:  Verbal ?  Consent given by:  Patient ?  Risks, benefits, and alternatives were discussed: yes   ?  Risks discussed:  Bleeding, infection, pain and incomplete drainage ?  Alternatives discussed:  No treatment ?Universal protocol:  ?  Procedure explained and questions answered to patient or proxy's satisfaction: yes   ?  Relevant documents present and verified: yes   ?  Imaging studies available: yes   ?  Patient identity confirmed:  Verbally with patient ?Location:  ?  Location:  Knee ?  Knee:  L knee ?Anesthesia:  ?  Anesthesia method:  Local infiltration ?  Local anesthetic:  Lidocaine 1% w/o epi ?Procedure details:  ?  Preparation: Patient was prepped and draped in usual sterile fashion   ?  Needle gauge:  18 G ?  Ultrasound guidance: no   ?  Approach:  Medial ?  Aspirate amount:  89ml ?  Aspirate characteristics:  Cloudy and blood-tinged ?  Steroid injected: no   ?  Specimen collected: yes   ?Post-procedure details:  ?  Dressing:  Sterile dressing ?  Procedure completion:  Tolerated ? ?  ?Laurence Compton, MD ?12/21/21 2126 ? ?  ?Glynn Octave, MD ?12/21/21 2237 ? ?

## 2021-12-21 NOTE — Progress Notes (Signed)
Left lower extremity venous duplex completed. ?Refer to "CV Proc" under chart review to view preliminary results. ? ?12/21/2021 4:38 PM ?Kelby Aline., MHA, RVT, RDCS, RDMS  p  ?

## 2021-12-22 ENCOUNTER — Other Ambulatory Visit (HOSPITAL_BASED_OUTPATIENT_CLINIC_OR_DEPARTMENT_OTHER): Payer: Self-pay

## 2021-12-22 LAB — GLUCOSE, BODY FLUID OTHER: Glucose, Body Fluid Other: 85 mg/dL

## 2021-12-25 LAB — BODY FLUID CULTURE W GRAM STAIN: Culture: NO GROWTH

## 2022-01-19 ENCOUNTER — Other Ambulatory Visit: Payer: Self-pay

## 2022-01-19 ENCOUNTER — Emergency Department (HOSPITAL_BASED_OUTPATIENT_CLINIC_OR_DEPARTMENT_OTHER): Payer: Medicaid Other

## 2022-01-19 ENCOUNTER — Emergency Department (HOSPITAL_BASED_OUTPATIENT_CLINIC_OR_DEPARTMENT_OTHER)
Admission: EM | Admit: 2022-01-19 | Discharge: 2022-01-19 | Disposition: A | Discharge status: Home / Self Care | Payer: Medicaid Other | Attending: Emergency Medicine | Admitting: Emergency Medicine

## 2022-01-19 ENCOUNTER — Encounter (HOSPITAL_BASED_OUTPATIENT_CLINIC_OR_DEPARTMENT_OTHER): Payer: Self-pay | Admitting: Emergency Medicine

## 2022-01-19 DIAGNOSIS — M7989 Other specified soft tissue disorders: Secondary | ICD-10-CM | POA: Diagnosis not present

## 2022-01-19 DIAGNOSIS — M25562 Pain in left knee: Secondary | ICD-10-CM | POA: Diagnosis present

## 2022-01-19 MED ORDER — KETOROLAC TROMETHAMINE 30 MG/ML IJ SOLN
30.0000 mg | Freq: Once | INTRAMUSCULAR | Status: AC
Start: 2022-01-19 — End: 2022-01-19
  Administered 2022-01-19: 30 mg via INTRAMUSCULAR
  Filled 2022-01-19: qty 1

## 2022-01-19 NOTE — ED Notes (Signed)
Left knee ace wrapped for support.

## 2022-01-19 NOTE — Discharge Instructions (Signed)
The fluid that was on your joint last week has resolved with the drainage.  I would keep the Ace wrap on to help with compression and swelling.  Take anti-inflammatory such as ibuprofen or Aleve or Motrin at home.  Elevate the knee as well and ice.  Follow-up with orthopedic doctor.  Return to ER with worsening symptoms.

## 2022-01-19 NOTE — ED Triage Notes (Signed)
Left knee pain flare up since last night.  No new injuries.  More swollen than usual.

## 2022-01-19 NOTE — ED Provider Notes (Signed)
[facility] EMERGENCY DEPARTMENT Provider Note   CSN: [id number] Arrival date & time: 01/19/22  1041     History  Chief Complaint  Patient presents with   Knee Pain    Jodi Kim is a 66 y.o. female.  HPI 66 year old presents to the emergency department today for evaluation of left knee pain and swelling.  Started last night.  Patient was recently seen in the ER with a arthrocentesis performed for left knee swelling.  No evidence of infection or gout.  Patient has not followed up with orthopedic provider.  He is taken no medications for symptoms prior to arrival.  Denies any fevers, warmth.  No injuries.  No numbness or tingling.    Home Medications Prior to Admission medications   Medication Sig Start Date End Date Taking? Authorizing Provider  albuterol (PROVENTIL HFA;VENTOLIN HFA) 108 (90 BASE) MCG/ACT inhaler Inhale 2 puffs into the lungs every 4 (four) hours as needed for wheezing or shortness of breath.    [provider]  doxazosin (CARDURA) 1 MG tablet Take 1 mg by mouth at bedtime.    [provider]  doxycycline (VIBRAMYCIN) 100 MG capsule Take 1 capsule (100 mg total) by mouth 2 (two) times daily. Patient not taking: Reported on 09/24/2014 07/24/12   Fayrene Helperran, Bowie, PA-C  DULoxetine HCl (CYMBALTA PO) Take 1 capsule by mouth daily.    [provider]  guaiFENesin (ROBITUSSIN) 100 MG/5ML liquid Take 5-10 mLs (100-200 mg total) by mouth every 4 (four) hours as needed for cough. Patient not taking: Reported on 09/24/2014 07/24/12   Fayrene Helperran, Bowie, PA-C  HYDROcodone-acetaminophen (LORTAB) 7.5-500 MG/15ML solution Take 15 mLs by mouth every 6 (six) hours as needed for pain or cough. Patient not taking: Reported on 09/24/2014 07/24/12   Fayrene Helperran, Bowie, PA-C  HYDROcodone-acetaminophen (NORCO/VICODIN) 5-325 MG tablet Take 1 tablet by mouth every 6 (six) hours as needed for moderate pain. 12/14/21   Vanetta MuldersZackowski, Scott, MD  levothyroxine (SYNTHROID,  LEVOTHROID) 100 MCG tablet Take 100 mcg by mouth daily.    [provider]  meloxicam (MOBIC) 7.5 MG tablet Take 1 tablet (7.5 mg total) by mouth daily. 12/21/21   Rancour, Jeannett SeniorStephen, MD  metFORMIN (GLUCOPHAGE) 1000 MG tablet Take 1,000 mg by mouth 2 (two) times daily with a meal.    [provider]  omeprazole (PRILOSEC) 40 MG capsule Take 40 mg by mouth daily.    [provider]  ondansetron (ZOFRAN ODT) 4 MG disintegrating tablet 4mg  ODT q4 hours prn nausea/vomit 06/04/21   Charlynne PanderYao, David Hsienta, MD  oseltamivir (TAMIFLU) 75 MG capsule Take 1 capsule (75 mg total) by mouth every 12 (twelve) hours. 06/04/21   Charlynne PanderYao, David Hsienta, MD  potassium chloride SA (KLOR-CON) 20 MEQ tablet Take 1 tablet (20 mEq total) by mouth daily. 06/04/21   Charlynne PanderYao, David Hsienta, MD  QUEtiapine (SEROQUEL) 300 MG tablet Take 300 mg by mouth 2 (two) times daily.     [provider]      Allergies    Iodinated contrast media    Review of Systems   Review of Systems Please refer to the HPI Physical Exam Updated Vital Signs BP (!) 143/80 (BP Location: Left Arm)   Pulse 92   Temp 97.8 F (36.6 C) (Oral)   Resp 16   Ht 5\' 4"  (1.626 m)   Wt 90.7 kg   SpO2 97%   BMI 34.33 kg/m  Physical Exam Vitals and nursing note reviewed.  Constitutional:  General: She is not in acute distress.    Appearance: She is well-developed. She is not ill-appearing or toxic-appearing.  HENT:     Head: Normocephalic and atraumatic.  Eyes:     General: No scleral icterus.       Right eye: No discharge.        Left eye: No discharge.  Pulmonary:     Effort: No respiratory distress.  Musculoskeletal:        General: Swelling present. No tenderness, deformity or signs of injury. Normal range of motion.     Cervical back: Normal range of motion.     Comments: No warmth noted.  Skin:    Capillary Refill: Capillary refill takes less than 2 seconds.     Coloration: Skin is not pale.     Findings: No  erythema.  Neurological:     Mental Status: She is alert.  Psychiatric:        Behavior: Behavior normal.        Thought Content: Thought content normal.        Judgment: Judgment normal.    ED Results / Procedures / Treatments   Labs (all labs ordered are listed, but only abnormal results are displayed) Labs Reviewed - No data to display  EKG None  Radiology DG Knee Complete 4 Views Left  Result Date: 01/19/2022 CLINICAL DATA:  Left knee pain flare up since last night. Left knee swelling. No new injury. More swollen than usual. EXAM: LEFT KNEE - COMPLETE 4+ VIEW COMPARISON:  Left knee radiographs 12/21/2021 FINDINGS: There is severe medial compartment joint space narrowing and peripheral osteophytosis, similar to prior. Mild lateral compartment joint space narrowing and moderate peripheral degenerative osteophytes, unchanged. Severe patellofemoral joint space narrowing and peripheral osteophytosis. Mild chronic enthesopathic spurring at the quadriceps and patellar tendon insertions on the patella. No joint effusion. No acute fracture is seen. No dislocation. IMPRESSION: 1. 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 2. Resolution of the prior large joint effusion. Electronically Signed   By: Neita Garnet M.D.   On: 01/19/2022 12:20    Procedures Procedures    Medications Ordered in ED Medications  ketorolac (TORADOL) 30 MG/ML injection 30 mg (has no administration in time range)    ED Course/ Medical Decision Making/ A&P                           Medical Decision Making 66 year old presents to the ER for evaluation of left knee pain and swelling.  Recent ER visit with arthrocentesis revealed no evidence of septic arthritis or gout.  Patient denies any new injuries to the knee.  Exam not consistent with a septic arthritis at this time.  Low suspicion for DVT and recently had ultrasound to rule this out as well.  X-ray was reviewed by  myself and interpreted that does not show any evidence of large effusion or bony involvement.  There is no traumatic malalignment.  Patient to be treated symptomatically with RICE, compression and orthopedic follow-up  Amount and/or Complexity of Data Reviewed Independent Historian: parent External Data Reviewed: labs, radiology and notes.    Details: Recent ED visit Radiology: ordered.  Risk Prescription drug management.           Final Clinical Impression(s) / ED Diagnoses Final diagnoses:  Acute pain of left knee    Rx / DC Orders ED Discharge Orders     None  Rise Mu, PA-C 01/19/22 1618    Alvira Monday, MD 01/19/22 [phone]

## 2022-01-29 ENCOUNTER — Encounter (HOSPITAL_BASED_OUTPATIENT_CLINIC_OR_DEPARTMENT_OTHER): Payer: Self-pay

## 2022-01-29 ENCOUNTER — Other Ambulatory Visit (HOSPITAL_BASED_OUTPATIENT_CLINIC_OR_DEPARTMENT_OTHER): Payer: Self-pay

## 2022-01-29 ENCOUNTER — Other Ambulatory Visit: Payer: Self-pay

## 2022-01-29 ENCOUNTER — Emergency Department (HOSPITAL_BASED_OUTPATIENT_CLINIC_OR_DEPARTMENT_OTHER)
Admission: EM | Admit: 2022-01-29 | Discharge: 2022-01-29 | Disposition: A | Discharge status: Home / Self Care | Payer: Medicaid Other | Attending: Emergency Medicine | Admitting: Emergency Medicine

## 2022-01-29 ENCOUNTER — Emergency Department (HOSPITAL_BASED_OUTPATIENT_CLINIC_OR_DEPARTMENT_OTHER): Payer: Medicaid Other

## 2022-01-29 DIAGNOSIS — S0990XA Unspecified injury of head, initial encounter: Secondary | ICD-10-CM | POA: Diagnosis present

## 2022-01-29 DIAGNOSIS — M79602 Pain in left arm: Secondary | ICD-10-CM | POA: Diagnosis not present

## 2022-01-29 DIAGNOSIS — R0789 Other chest pain: Secondary | ICD-10-CM | POA: Diagnosis not present

## 2022-01-29 DIAGNOSIS — S0093XA Contusion of unspecified part of head, initial encounter: Secondary | ICD-10-CM | POA: Diagnosis not present

## 2022-01-29 DIAGNOSIS — W01198A Fall on same level from slipping, tripping and stumbling with subsequent striking against other object, initial encounter: Secondary | ICD-10-CM | POA: Insufficient documentation

## 2022-01-29 DIAGNOSIS — M542 Cervicalgia: Secondary | ICD-10-CM | POA: Insufficient documentation

## 2022-01-29 DIAGNOSIS — M79601 Pain in right arm: Secondary | ICD-10-CM | POA: Insufficient documentation

## 2022-01-29 DIAGNOSIS — W19XXXA Unspecified fall, initial encounter: Secondary | ICD-10-CM

## 2022-01-29 MED ORDER — METHOCARBAMOL 500 MG PO TABS
500.0000 mg | ORAL_TABLET | Freq: Once | ORAL | Status: AC
  Administered 2022-01-29: 500 mg via ORAL
  Filled 2022-01-29: qty 1

## 2022-01-29 MED ORDER — MORPHINE SULFATE (PF) 4 MG/ML IV SOLN
4.0000 mg | Freq: Once | INTRAVENOUS | Status: AC
  Administered 2022-01-29: 4 mg via INTRAVENOUS
  Filled 2022-01-29: qty 1

## 2022-01-29 MED ORDER — METHOCARBAMOL 500 MG PO TABS
500.0000 mg | ORAL_TABLET | Freq: Every evening | ORAL | 0 refills | Status: DC | PRN
  Filled 2022-01-29: qty 10, 10d supply, fill #0

## 2022-07-22 ENCOUNTER — Encounter (HOSPITAL_BASED_OUTPATIENT_CLINIC_OR_DEPARTMENT_OTHER): Payer: Self-pay | Admitting: Emergency Medicine

## 2022-07-22 ENCOUNTER — Emergency Department (HOSPITAL_BASED_OUTPATIENT_CLINIC_OR_DEPARTMENT_OTHER)
Admission: EM | Admit: 2022-07-22 | Discharge: 2022-07-22 | Disposition: A | Discharge status: Home / Self Care | Payer: Medicaid Other | Attending: Emergency Medicine | Admitting: Emergency Medicine

## 2022-07-22 ENCOUNTER — Other Ambulatory Visit: Payer: Self-pay

## 2022-07-22 DIAGNOSIS — H5712 Ocular pain, left eye: Secondary | ICD-10-CM | POA: Diagnosis present

## 2022-07-22 DIAGNOSIS — H1033 Unspecified acute conjunctivitis, bilateral: Secondary | ICD-10-CM | POA: Diagnosis not present

## 2022-07-22 DIAGNOSIS — Z7984 Long term (current) use of oral hypoglycemic drugs: Secondary | ICD-10-CM | POA: Insufficient documentation

## 2022-07-22 MED ORDER — FLUORESCEIN SODIUM 1 MG OP STRP
1.0000 | ORAL_STRIP | Freq: Once | OPHTHALMIC | Status: DC
Start: 2022-07-22 — End: 2022-07-22
  Filled 2022-07-22: qty 1

## 2022-07-22 MED ORDER — TETRACAINE HCL 0.5 % OP SOLN
2.0000 [drp] | Freq: Once | OPHTHALMIC | Status: DC
  Filled 2022-07-22: qty 4

## 2022-07-22 NOTE — ED Notes (Signed)
ED Provider at bedside. 

## 2022-07-22 NOTE — ED Provider Notes (Signed)
[facility] EMERGENCY DEPARTMENT Provider Note   CSN: [id number] Arrival date & time: 07/22/22  1025     History  Chief Complaint  Patient presents with   Eye Pain    Jodi Kim is a 66 y.o. female.  Patient is a 66 year old female with a past medical history of cataract surgery about 6 weeks ago presenting to the emergency department with left eye pain.  She states that she had bilateral cataract surgery and was seen by her eye doctor on Monday and was doing well.  She states that she woke up this morning with severe pain and photophobia in her left eye.  She denies any vision changes.  She denies any trauma to her eye.  The history is provided by the patient.  Eye Pain       Home Medications Prior to Admission medications   Medication Sig Start Date End Date Taking? Authorizing Provider  albuterol (PROVENTIL HFA;VENTOLIN HFA) 108 (90 BASE) MCG/ACT inhaler Inhale 2 puffs into the lungs every 4 (four) hours as needed for wheezing or shortness of breath.    [provider]  doxazosin (CARDURA) 1 MG tablet Take 1 mg by mouth at bedtime.    [provider]  doxycycline (VIBRAMYCIN) 100 MG capsule Take 1 capsule (100 mg total) by mouth 2 (two) times daily. Patient not taking: Reported on 09/24/2014 07/24/12   Fayrene Helper, PA-C  DULoxetine HCl (CYMBALTA PO) Take 1 capsule by mouth daily.    [provider]  guaiFENesin (ROBITUSSIN) 100 MG/5ML liquid Take 5-10 mLs (100-200 mg total) by mouth every 4 (four) hours as needed for cough. Patient not taking: Reported on 09/24/2014 07/24/12   Fayrene Helper, PA-C  HYDROcodone-acetaminophen (LORTAB) 7.5-500 MG/15ML solution Take 15 mLs by mouth every 6 (six) hours as needed for pain or cough. Patient not taking: Reported on 09/24/2014 07/24/12   Fayrene Helper, PA-C  HYDROcodone-acetaminophen (NORCO/VICODIN) 5-325 MG tablet Take 1 tablet by mouth every 6 (six) hours as needed for moderate pain. 12/14/21    Vanetta Mulders, MD  levothyroxine (SYNTHROID, LEVOTHROID) 100 MCG tablet Take 100 mcg by mouth daily.    [provider]  meloxicam (MOBIC) 7.5 MG tablet Take 1 tablet (7.5 mg total) by mouth daily. 12/21/21   Rancour, Jeannett Senior, MD  metFORMIN (GLUCOPHAGE) 1000 MG tablet Take 1,000 mg by mouth 2 (two) times daily with a meal.    [provider]  methocarbamol (ROBAXIN) 500 MG tablet Take 1 tablet (500 mg total) by mouth at bedtime as needed for muscle spasms. 01/29/22   Caccavale, Sophia, PA-C  omeprazole (PRILOSEC) 40 MG capsule Take 40 mg by mouth daily.    [provider]  ondansetron (ZOFRAN ODT) 4 MG disintegrating tablet 4mg  ODT q4 hours prn nausea/vomit 06/04/21   06/06/21, MD  oseltamivir (TAMIFLU) 75 MG capsule Take 1 capsule (75 mg total) by mouth every 12 (twelve) hours. 06/04/21   06/06/21, MD  potassium chloride SA (KLOR-CON) 20 MEQ tablet Take 1 tablet (20 mEq total) by mouth daily. 06/04/21   06/06/21, MD  QUEtiapine (SEROQUEL) 300 MG tablet Take 300 mg by mouth 2 (two) times daily.     [provider]      Allergies    Iodinated contrast media    Review of Systems   Review of Systems  Eyes:  Positive for pain.    Physical Exam Updated Vital Signs BP 132/72   Pulse 89   Temp  98 F (36.7 C) (Oral)   Resp 17   SpO2 97%  Physical Exam Vitals and nursing note reviewed.  Constitutional:      General: She is not in acute distress.    Appearance: Normal appearance.  HENT:     Head: Normocephalic and atraumatic.     Nose: Nose normal.     Mouth/Throat:     Mouth: Mucous membranes are moist.     Pharynx: Oropharynx is clear.  Eyes:     Extraocular Movements: Extraocular movements intact.     Pupils: Pupils are equal, round, and reactive to light.     Comments: Bilateral conjunctival injection  Cardiovascular:     Rate and Rhythm: Normal rate.  Pulmonary:     Effort: Pulmonary effort is normal.   Abdominal:     General: Abdomen is flat.  Musculoskeletal:        General: Normal range of motion.     Cervical back: Normal range of motion and neck supple.  Skin:    General: Skin is warm and dry.  Neurological:     General: No focal deficit present.     Mental Status: She is alert and oriented to person, place, and time.  Psychiatric:        Mood and Affect: Mood normal.        Behavior: Behavior normal.     ED Results / Procedures / Treatments   Labs (all labs ordered are listed, but only abnormal results are displayed) Labs Reviewed - No data to display  EKG None  Radiology No results found.  Procedures Procedures    Medications Ordered in ED Medications  fluorescein ophthalmic strip 1 strip (has no administration in time range)  tetracaine (PONTOCAINE) 0.5 % ophthalmic solution 2 drop (has no administration in time range)    ED Course/ Medical Decision Making/ A&P Clinical Course as of 07/22/22 1552  Thu Jul 22, 2022  1436 Patient was unsure who her ophthalmologist was and after reaching out to different clinics it was determined that she is with [facility]. I spoke with Dr. Dione Booze who recommends the patient follow-up in the office this afternoon for further eye evaluation.  Patient is agreeable with the plan. [VK]    Clinical Course User Index [VK] Rexford Maus, DO                           Medical Decision Making This patient presents to the ED with chief complaint(s) of eye pain with pertinent past medical history of recent cataracts surgery which further complicates the presenting complaint. The complaint involves an extensive differential diagnosis and also carries with it a high risk of complications and morbidity.    The differential diagnosis includes corneal abrasion, ulceration, conjunctivitis, scleritis, acute angle-closure glaucoma  Additional history obtained: Additional history obtained from N/A Records reviewed  N/A  ED Course and Reassessment: Patient was uncomfortable appearing applied cough and sunglasses on with significant photophobia on arrival.  She did have bilateral conjunctival injections but pupils were equal and reactive bilaterally.  Visual acuity was performed and she did have significantly decreased vision in the left eye compared to the right though patient reports that her vision seems to be at her baseline.  Fluorescein staining was performed bilaterally that showed no evidence of corneal abrasion or ulceration.  Pressures were performed bilaterally and she had a pressure of 15 on the right and 14 on the left  making acute angle-closure glaucoma less likely.  Plan will be to reach out to her ophthalmologist.  Independent labs interpretation:  N/A  Independent visualization of imaging: N/A  Consultation: - Consulted or discussed management/test interpretation w/ external professional: Ophthalmology  Consideration for admission or further workup: Patient requires further workup and evaluation at her ophthalmology office Social Determinants of health: N/A    Risk Prescription drug management.          Final Clinical Impression(s) / ED Diagnoses Final diagnoses:  Pain of left eye    Rx / DC Orders ED Discharge Orders     None         Rexford Maus, DO 07/22/22 1552

## 2022-07-22 NOTE — ED Triage Notes (Signed)
Patient presents to ED via POV from home. Here with left eye pain that began today. Cataract surgery 1 month ago. Reports she had a follow up appointment on Monday and was told everything was progressing well and to follow up in 6 months.

## 2022-07-22 NOTE — ED Notes (Signed)
EDP at bedside  

## 2022-07-22 NOTE — Discharge Instructions (Signed)
You were seen in the emergency department for your eye pain.  You had no obvious scratches or ulcers on your eye and your eye pressures were normal here.  You should follow-up with your eye doctor at this afternoon to have your eye rechecked to determine the cause of your pain.  You should return to the emergency department if you are losing vision in your eye, your pain becomes significantly more severe or if you have any other new or concerning symptoms.

## 2022-10-27 ENCOUNTER — Emergency Department (HOSPITAL_BASED_OUTPATIENT_CLINIC_OR_DEPARTMENT_OTHER)
Admission: EM | Admit: 2022-10-27 | Discharge: 2022-10-27 | Disposition: A | Discharge status: Home / Self Care | Payer: Medicaid Other | Attending: Emergency Medicine | Admitting: Emergency Medicine

## 2022-10-27 ENCOUNTER — Emergency Department (HOSPITAL_BASED_OUTPATIENT_CLINIC_OR_DEPARTMENT_OTHER): Payer: Medicaid Other

## 2022-10-27 ENCOUNTER — Encounter (HOSPITAL_BASED_OUTPATIENT_CLINIC_OR_DEPARTMENT_OTHER): Payer: Self-pay | Admitting: Urology

## 2022-10-27 DIAGNOSIS — M79644 Pain in right finger(s): Secondary | ICD-10-CM

## 2022-10-27 NOTE — Discharge Instructions (Signed)
If you develop any signs of infection please return for reevaluation otherwise follow-up with Dr. Grandville Silos to discuss options.

## 2022-10-27 NOTE — ED Provider Notes (Signed)
[place] [facility]GENC[facility]MENT AT [place] [facility] Provider Note   CSN: [id number] Arrival date & time: 10/27/22  1046     History  Chief Complaint  Patient presents with   Foreign Body in [place] is a 67 y.o. female.  Patient here for evaluation of foreign body in her right middle finger.  States that about 3 to 4 months ago she got a little bit of glass in her right index finger.  Is been intermittently bothering her.  She denies any fever.  She denies any pain or swelling.  Just discomfort when she grabs objects at times.  She would like to get this removed.  The history is provided by the patient.       Home Medications Prior to Admission medications   Medication Sig Start Date End Date Taking? Authorizing Provider  albuterol (PROVENTIL HFA;VENTOLIN HFA) 108 (90 BASE) MCG/ACT inhaler Inhale 2 puffs into the lungs every 4 (four) hours as needed for wheezing or shortness of breath.    [provider]  doxazosin (CARDURA) 1 MG tablet Take 1 mg by mouth at bedtime.    [provider]  doxycycline (VIBRAMYCIN) 100 MG capsule Take 1 capsule (100 mg total) by mouth 2 (two) times daily. Patient not taking: Reported on 09/24/2014 07/24/12   Domenic Moras, PA-C  DULoxetine HCl (CYMBALTA PO) Take 1 capsule by mouth daily.    [provider]  guaiFENesin (ROBITUSSIN) 100 MG/5ML liquid Take 5-10 mLs (100-200 mg total) by mouth every 4 (four) hours as needed for cough. Patient not taking: Reported on 09/24/2014 07/24/12   Domenic Moras, PA-C  HYDROcodone-acetaminophen (LORTAB) 7.5-500 MG/15ML solution Take 15 mLs by mouth every 6 (six) hours as needed for pain or cough. Patient not taking: Reported on 09/24/2014 07/24/12   Domenic Moras, PA-C  HYDROcodone-acetaminophen (NORCO/VICODIN) 5-325 MG tablet Take 1 tablet by mouth every 6 (six) hours as needed for moderate pain. 12/14/21   Fredia Sorrow, MD  levothyroxine (SYNTHROID, LEVOTHROID) 100 MCG  tablet Take 100 mcg by mouth daily.    [provider]  meloxicam (MOBIC) 7.5 MG tablet Take 1 tablet (7.5 mg total) by mouth daily. 12/21/21   Rancour, Annie Main, MD  metFORMIN (GLUCOPHAGE) 1000 MG tablet Take 1,000 mg by mouth 2 (two) times daily with a meal.    [provider]  methocarbamol (ROBAXIN) 500 MG tablet Take 1 tablet (500 mg total) by mouth at bedtime as needed for muscle spasms. 01/29/22   Caccavale, Sophia, PA-C  omeprazole (PRILOSEC) 40 MG capsule Take 40 mg by mouth daily.    [provider]  ondansetron (ZOFRAN ODT) 4 MG disintegrating tablet 4mg  ODT q4 hours prn nausea/vomit 06/04/21   Drenda Freeze, MD  oseltamivir (TAMIFLU) 75 MG capsule Take 1 capsule (75 mg total) by mouth every 12 (twelve) hours. 06/04/21   Drenda Freeze, MD  potassium chloride SA (KLOR-CON) 20 MEQ tablet Take 1 tablet (20 mEq total) by mouth daily. 06/04/21   Drenda Freeze, MD  QUEtiapine (SEROQUEL) 300 MG tablet Take 300 mg by mouth 2 (two) times daily.     [provider]      Allergies    Iodinated contrast media    Review of Systems   Review of Systems  Physical Exam Updated Vital Signs BP (!) 146/76 (BP Location: Left Arm)   Pulse 87   Temp 97.9 F (36.6 C) (Oral)   Resp 18   Ht 5'  4" (1.626 m)   Wt 95.3 kg   SpO2 97%   BMI 36.06 kg/m  Physical Exam Cardiovascular:     Pulses: Normal pulses.  Musculoskeletal:        General: Tenderness present. No swelling or deformity. Normal range of motion.     Comments: Mild tenderness to the pad of the right middle finger but there is no swelling or redness or discharge or deformity  Skin:    General: Skin is warm.     Findings: No bruising or erythema.  Neurological:     General: No focal deficit present.     Mental Status: She is alert.     Sensory: No sensory deficit.     Motor: No weakness.     ED Results / Procedures / Treatments   Labs (all labs ordered are listed, but only  abnormal results are displayed) Labs Reviewed - No data to display  EKG None  Radiology DG Finger Middle Right  Result Date: 10/27/2022 CLINICAL DATA:  Injury. EXAM: RIGHT MIDDLE FINGER 3V COMPARISON:  None Available. FINDINGS: No acute fracture or dislocation. Preserved joint spaces and bone mineralization. On the lateral view there is a radiopaque density seen measuring measuring a proximally 4 mm overlying the soft tissues anterior to the midshaft of the distal phalanx of the third digit. Possible radiopaque foreign body. Please correlate for location of injury. IMPRESSION: On the lateral view there is a radiopaque density seen measuring measuring a proximally 4 mm overlying the soft tissues anterior to the midshaft of the distal phalanx of the third digit. Possible radiopaque foreign body. Please correlate for location of injury. Electronically Signed   By: Jill Side M.D.   On: 10/27/2022 11:50    Procedures Procedures    Medications Ordered in ED Medications - No data to display  ED Course/ Medical Decision Making/ A&P                             Medical Decision Making Amount and/or Complexity of Data Reviewed Radiology: ordered.   Jodi Kim is here for evaluation of foreign body in the middle finger of her right hand.  States piece of glass got stuck there about 3 to 4 months ago.  Intermittently has bothered her since.  X-ray does show likely of small piece of glass in the pad of her right middle finger.  However on exam there is no signs of infectious process.  This has been bothersome for the patient and ultimately I do not think it is something that I will try to remove but will have her follow-up with the hand doctor to discuss options.  There is no sign of infection overall she is very well-appearing.  She understands return precautions.  Discharged in good condition.  This chart was dictated using voice recognition software.  Despite best efforts to proofread,   errors can occur which can change the documentation meaning.         Final Clinical Impression(s) / ED Diagnoses Final diagnoses:  Finger pain, right    Rx / DC Orders ED Discharge Orders     None         Lennice Sites, DO 10/27/22 1214

## 2022-10-27 NOTE — ED Triage Notes (Signed)
Pt states got glass in right middle finger over 3 months ago and it is still bugging her after picking at it No redness or swelling noted

## 2023-12-11 IMAGING — CR DG KNEE COMPLETE 4+V*L*
5 series · 5 of 5 positions shown · non-contrast
Comparison: Left knee x-ray 04/07/2021

CLINICAL DATA: Fall, knee pain

EXAM:
LEFT KNEE - COMPLETE 4+ VIEW

[knee ap]
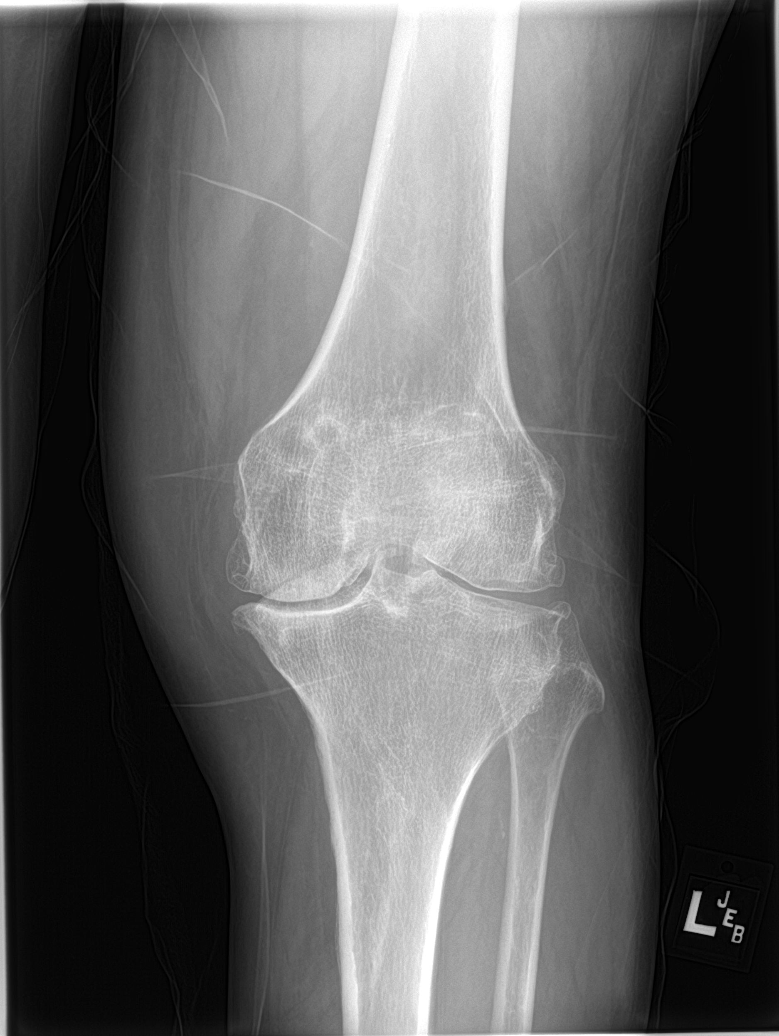

[knee lat (1 of 2)]
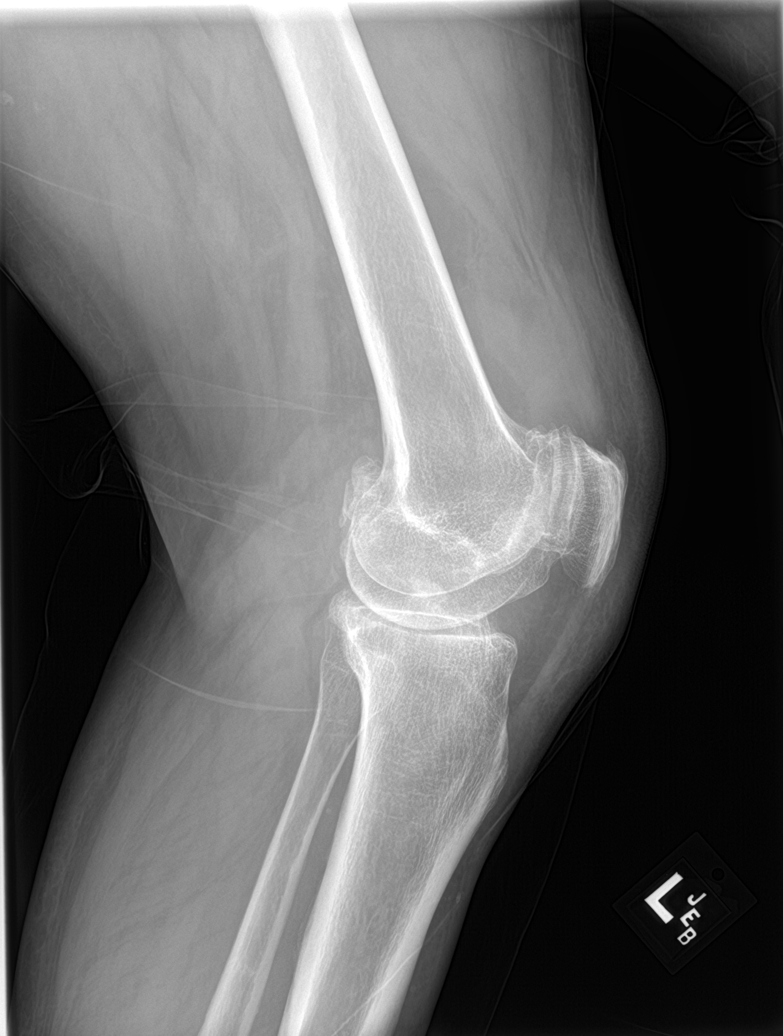

[knee obl (1 of 2)]
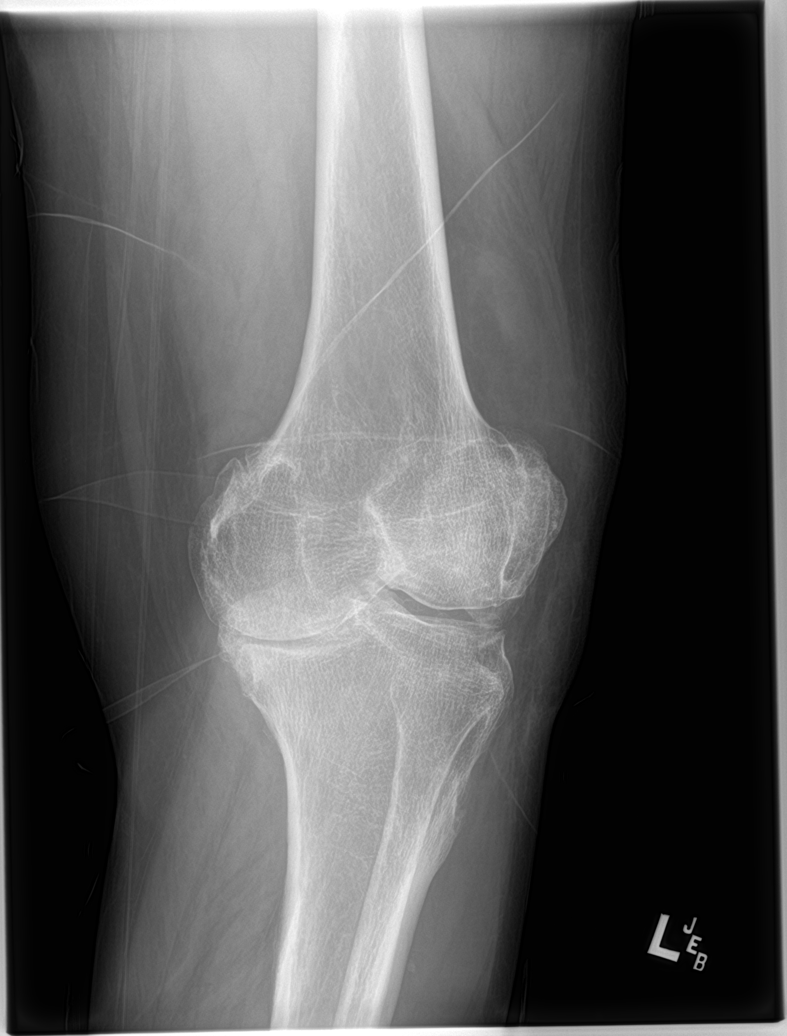

[knee obl (2 of 2)]
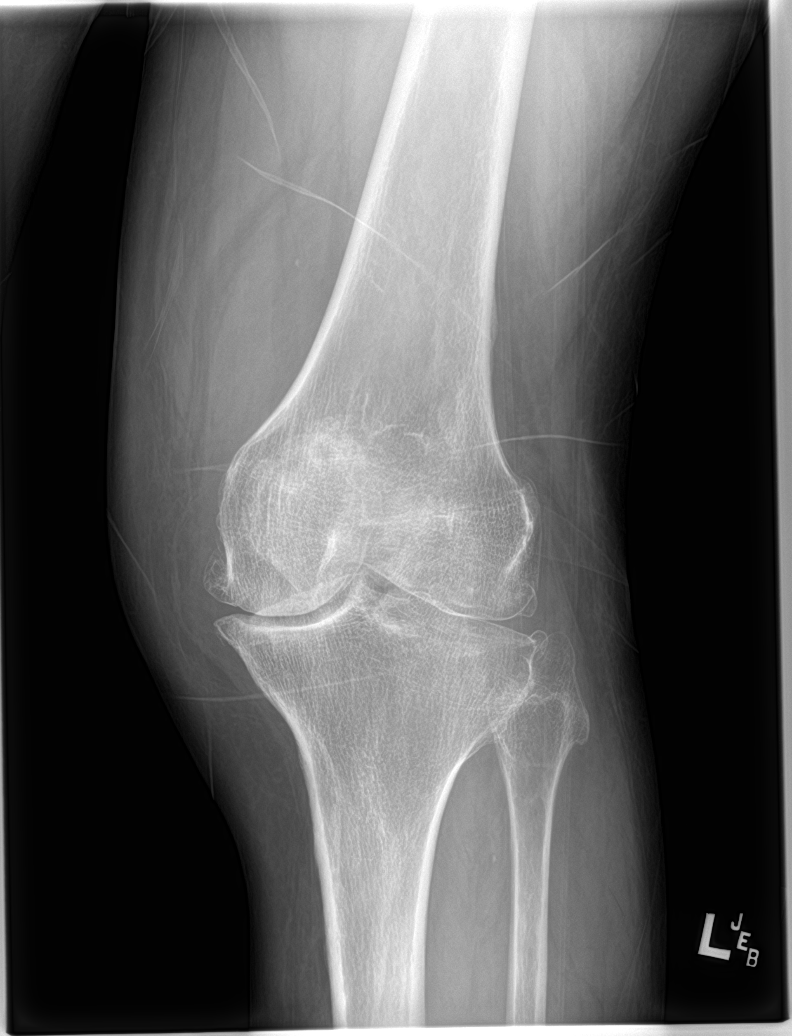

[knee lat (2 of 2)]
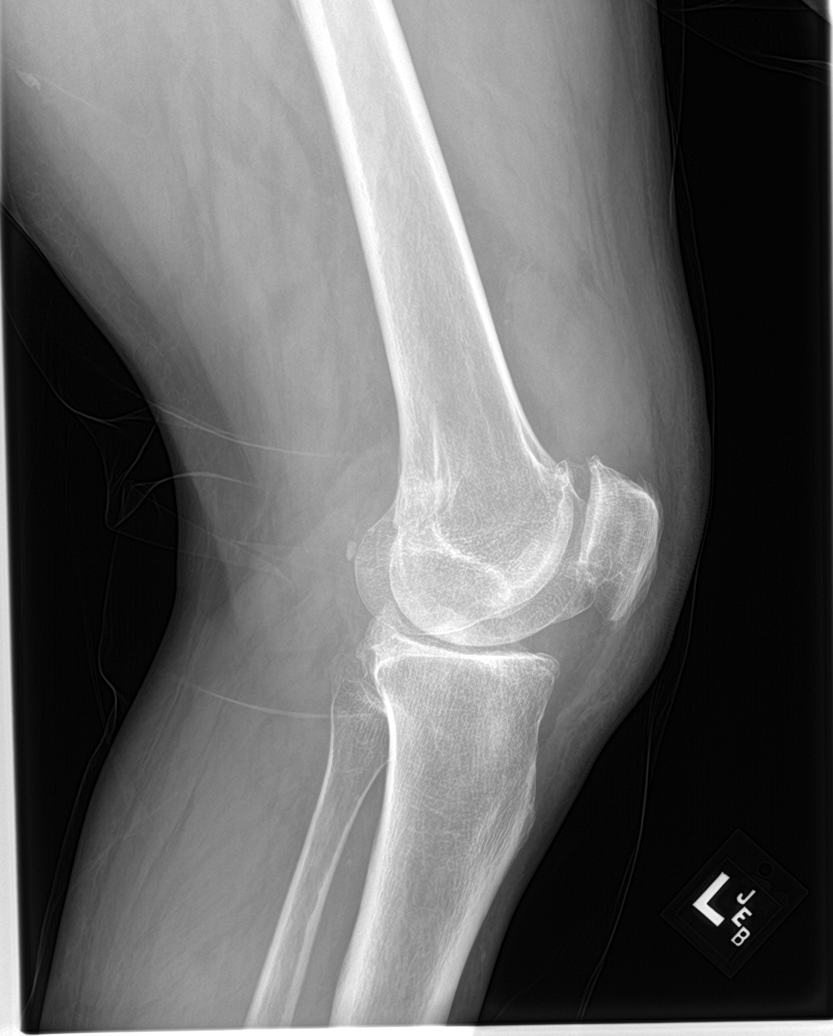

[5 of 5 positions shown; findings below may reference images not displayed]

FINDINGS: No acute fracture or dislocation identified. Moderate
tricompartmental joint space narrowing with marginal osteophytes.
Large joint effusion.
IMPRESSION: 1. Large joint effusion.
2. No acute osseous abnormality identified.  Degenerative changes.

## 2024-03-26 ENCOUNTER — Encounter (HOSPITAL_COMMUNITY): Payer: Self-pay

## 2024-03-26 ENCOUNTER — Other Ambulatory Visit: Payer: Self-pay

## 2024-03-26 ENCOUNTER — Emergency Department (HOSPITAL_COMMUNITY)
Admission: EM | Admit: 2024-03-26 | Discharge: 2024-03-26 | Discharge status: Against Medical Advice | Payer: MEDICAID | Attending: Emergency Medicine | Admitting: Emergency Medicine

## 2024-03-26 ENCOUNTER — Emergency Department (HOSPITAL_COMMUNITY)
Admission: EM | Admit: 2024-03-26 | Discharge: 2024-03-26 | Disposition: A | Discharge status: Home / Self Care | Payer: MEDICAID | Attending: Emergency Medicine | Admitting: Emergency Medicine

## 2024-03-26 ENCOUNTER — Other Ambulatory Visit (HOSPITAL_BASED_OUTPATIENT_CLINIC_OR_DEPARTMENT_OTHER): Payer: Self-pay

## 2024-03-26 DIAGNOSIS — Z79899 Other long term (current) drug therapy: Secondary | ICD-10-CM | POA: Diagnosis not present

## 2024-03-26 DIAGNOSIS — R441 Visual hallucinations: Secondary | ICD-10-CM | POA: Diagnosis not present

## 2024-03-26 DIAGNOSIS — E039 Hypothyroidism, unspecified: Secondary | ICD-10-CM | POA: Insufficient documentation

## 2024-03-26 DIAGNOSIS — R443 Hallucinations, unspecified: Secondary | ICD-10-CM

## 2024-03-26 DIAGNOSIS — F1990 Other psychoactive substance use, unspecified, uncomplicated: Secondary | ICD-10-CM | POA: Diagnosis not present

## 2024-03-26 DIAGNOSIS — Z5321 Procedure and treatment not carried out due to patient leaving prior to being seen by health care provider: Secondary | ICD-10-CM | POA: Insufficient documentation

## 2024-03-26 DIAGNOSIS — R440 Auditory hallucinations: Secondary | ICD-10-CM | POA: Diagnosis present

## 2024-03-26 DIAGNOSIS — Z7984 Long term (current) use of oral hypoglycemic drugs: Secondary | ICD-10-CM | POA: Diagnosis not present

## 2024-03-26 DIAGNOSIS — F1090 Alcohol use, unspecified, uncomplicated: Secondary | ICD-10-CM | POA: Diagnosis not present

## 2024-03-26 DIAGNOSIS — I10 Essential (primary) hypertension: Secondary | ICD-10-CM | POA: Insufficient documentation

## 2024-03-26 DIAGNOSIS — E119 Type 2 diabetes mellitus without complications: Secondary | ICD-10-CM | POA: Insufficient documentation

## 2024-03-26 DIAGNOSIS — F1210 Cannabis abuse, uncomplicated: Secondary | ICD-10-CM | POA: Diagnosis present

## 2024-03-26 LAB — COMPREHENSIVE METABOLIC PANEL WITH GFR
ALT: 30 U/L (ref 0–44)
AST: 27 U/L (ref 15–41)
Albumin: 3.4 g/dL — ABNORMAL LOW (ref 3.5–5.0)
Alkaline Phosphatase: 63 U/L (ref 38–126)
Anion gap: 12 (ref 5–15)
BUN: 12 mg/dL (ref 8–23)
CO2: 23 mmol/L (ref 22–32)
Calcium: 9.1 mg/dL (ref 8.9–10.3)
Chloride: 108 mmol/L (ref 98–111)
Creatinine, Ser: 0.73 mg/dL (ref 0.44–1.00)
GFR, Estimated: >60 mL/min (ref >60)
Glucose, Bld: 130 mg/dL — ABNORMAL HIGH (ref 70–99)
Potassium: 4.2 mmol/L (ref 3.5–5.1)
Sodium: 143 mmol/L (ref 135–145)
Total Bilirubin: 0.3 mg/dL (ref 0.0–1.2)
Total Protein: 6.3 g/dL — ABNORMAL LOW (ref 6.5–8.1)

## 2024-03-26 LAB — CBC
HCT: 46.2 % — ABNORMAL HIGH (ref 36.0–46.0)
Hemoglobin: 14.6 g/dL (ref 12.0–15.0)
MCH: 32.4 pg (ref 26.0–34.0)
MCHC: 31.6 g/dL (ref 30.0–36.0)
MCV: 102.4 fL — ABNORMAL HIGH (ref 80.0–100.0)
Platelets: 263 K/uL (ref 150–400)
RBC: 4.51 MIL/uL (ref 3.87–5.11)
RDW: 12.9 % (ref 11.5–15.5)
WBC: 6.7 K/uL (ref 4.0–10.5)
nRBC: 0 % (ref 0.0–0.2)

## 2024-03-26 LAB — RAPID URINE DRUG SCREEN, HOSP PERFORMED
Amphetamines: NOT DETECTED
Barbiturates: NOT DETECTED
Benzodiazepines: NOT DETECTED
Cocaine: POSITIVE — AB
Opiates: NOT DETECTED
Tetrahydrocannabinol: NOT DETECTED

## 2024-03-26 LAB — T4, FREE: Free T4: 0.75 ng/dL (ref 0.61–1.12)

## 2024-03-26 LAB — SALICYLATE LEVEL: Salicylate Lvl: <7 mg/dL — ABNORMAL LOW (ref 7.0–30.0)

## 2024-03-26 LAB — ACETAMINOPHEN LEVEL: Acetaminophen (Tylenol), Serum: <10 ug/mL — ABNORMAL LOW (ref 10–30)

## 2024-03-26 LAB — ETHANOL: Alcohol, Ethyl (B): <15 mg/dL (ref <15)

## 2024-03-26 LAB — TSH: TSH: 4.326 u[IU]/mL (ref 0.350–4.500)

## 2024-03-26 MED ORDER — QUETIAPINE FUMARATE 300 MG PO TABS
300.0000 mg | ORAL_TABLET | Freq: Two times a day (BID) | ORAL | 0 refills | Status: DC
Start: 2024-03-26 — End: 2024-04-29
  Filled 2024-03-26: qty 60, 30d supply, fill #0

## 2024-03-26 MED ORDER — RISPERIDONE 0.5 MG PO TABS
0.5000 mg | ORAL_TABLET | Freq: Once | ORAL | Status: AC
  Administered 2024-03-26: 0.5 mg via ORAL
  Filled 2024-03-26 (×2): qty 1

## 2024-03-26 MED ORDER — ALBUTEROL SULFATE HFA 108 (90 BASE) MCG/ACT IN AERS
2.0000 | INHALATION_SPRAY | Freq: Once | RESPIRATORY_TRACT | Status: AC
  Administered 2024-03-26: 2 via RESPIRATORY_TRACT
  Filled 2024-03-26: qty 6.7

## 2024-03-26 NOTE — ED Provider Notes (Signed)
 [place] EMERGENCY DEPARTMENT AT [facility] Provider Note   CSN: [id number] Arrival date & time: 03/26/24  1151     Patient presents with: Drug Problem and Hallucinations   Jodi Kim is a 68 y.o. female past medical history significant for type 2 diabetes, hypothyroidism, hypertension, depression and anxiety who presents emergency department for drug problem.  Patient states that she is seeking help in detoxification for drug use.  Patient states that secondary to her drug use she has been unable to take her home medications.  Patient endorses last crack cocaine use approximately 12 hours ago.  Patient does endorse chronic alcohol use and last alcohol ingestion 2 days ago.  Patient does state she has a history of alcohol withdrawal however does not have a history of alcohol withdrawal seizures.  Patient states that secondary to her drug use she is endorsing auditory and visual hallucinations.  Patient denies suicidal ideation and homicidal ideation at this time    Drug Problem       Prior to Admission medications   Medication Sig Start Date End Date Taking? Authorizing Provider  albuterol  (PROVENTIL  HFA;VENTOLIN  HFA) 108 (90 BASE) MCG/ACT inhaler Inhale 2 puffs into the lungs every 4 (four) hours as needed for wheezing or shortness of breath.    [provider]  doxazosin (CARDURA) 1 MG tablet Take 1 mg by mouth at bedtime.    [provider]  doxycycline  (VIBRAMYCIN ) 100 MG capsule Take 1 capsule (100 mg total) by mouth 2 (two) times daily. Patient not taking: Reported on 09/24/2014 07/24/12   Nivia Colon, PA-C  DULoxetine HCl (CYMBALTA PO) Take 1 capsule by mouth daily.    [provider]  guaiFENesin  (ROBITUSSIN) 100 MG/5ML liquid Take 5-10 mLs (100-200 mg total) by mouth every 4 (four) hours as needed for cough. Patient not taking: Reported on 09/24/2014 07/24/12   Nivia Colon, PA-C  HYDROcodone -acetaminophen  (LORTAB) 7.5-500 MG/15ML  solution Take 15 mLs by mouth every 6 (six) hours as needed for pain or cough. Patient not taking: Reported on 09/24/2014 07/24/12   Nivia Colon, PA-C  HYDROcodone -acetaminophen  (NORCO/VICODIN) 5-325 MG tablet Take 1 tablet by mouth every 6 (six) hours as needed for moderate pain. 12/14/21   Zackowski, Scott, MD  levothyroxine (SYNTHROID, LEVOTHROID) 100 MCG tablet Take 100 mcg by mouth daily.    [provider]  meloxicam  (MOBIC ) 7.5 MG tablet Take 1 tablet (7.5 mg total) by mouth daily. 12/21/21   Rancour, Garnette, MD  metFORMIN (GLUCOPHAGE) 1000 MG tablet Take 1,000 mg by mouth 2 (two) times daily with a meal.    [provider]  methocarbamol  (ROBAXIN ) 500 MG tablet Take 1 tablet (500 mg total) by mouth at bedtime as needed for muscle spasms. 01/29/22   Caccavale, Sophia, PA-C  omeprazole (PRILOSEC) 40 MG capsule Take 40 mg by mouth daily.    [provider]  ondansetron  (ZOFRAN  ODT) 4 MG disintegrating tablet 4mg  ODT q4 hours prn nausea/vomit 06/04/21   Patt Alm Macho, MD  oseltamivir  (TAMIFLU ) 75 MG capsule Take 1 capsule (75 mg total) by mouth every 12 (twelve) hours. 06/04/21   Patt Alm Macho, MD  potassium chloride  SA (KLOR-CON ) 20 MEQ tablet Take 1 tablet (20 mEq total) by mouth daily. 06/04/21   Patt Alm Macho, MD  QUEtiapine  (SEROQUEL ) 300 MG tablet Take 300 mg by mouth 2 (two) times daily.     [provider]    Allergies: Iodinated contrast media    Review of Systems  Updated Vital Signs BP (!) 190/102 (BP Location: Right Arm)   Pulse 82   Temp 97.6 F (36.4 C)   Resp 16   Ht 5' 4 (1.626 m)   Wt 90.7 kg   SpO2 99%   BMI 34.33 kg/m   Physical Exam  (all labs ordered are listed, but only abnormal results are displayed) Labs Reviewed  COMPREHENSIVE METABOLIC PANEL WITH GFR - Abnormal; Notable for the following components:      Result Value   Glucose, Bld 130 (*)    Total Protein 6.3 (*)    Albumin 3.4 (*)    All other  components within normal limits  CBC - Abnormal; Notable for the following components:   HCT 46.2 (*)    MCV 102.4 (*)    All other components within normal limits  SALICYLATE LEVEL - Abnormal; Notable for the following components:   Salicylate Lvl <7.0 (*)    All other components within normal limits  ACETAMINOPHEN  LEVEL - Abnormal; Notable for the following components:   Acetaminophen  (Tylenol ), Serum <10 (*)    All other components within normal limits  ETHANOL  TSH  T4, FREE  RAPID URINE DRUG SCREEN, HOSP PERFORMED    EKG: None  Radiology: No results found.   Procedures   Medications Ordered in the ED  albuterol  (VENTOLIN  HFA) 108 (90 Base) MCG/ACT inhaler 2 puff (2 puffs Inhalation Given 03/26/24 1207)  risperiDONE  (RISPERDAL ) tablet 0.5 mg (0.5 mg Oral Given 03/26/24 1441)    Clinical Course as of 03/26/24 1540  Mon Mar 26, 2024  1411 EKG reviewed by me: Normal sinus rhythm with normal axis and intervals.  Nonspecific ST and T wave changes [AG]  1456 Metabolize to reassess AVH-- does need detox resources at DC if DC'ed [AG]  1533 S- hothyroid, htn Polysubstance use Avh w/ cocaine yd and etoh 2d On riserpdal and seroquel  No SI HI [RC]    Clinical Course User Index [AG] Nada Chroman, DO [RC] Sharyne Darina RAMAN, MD                                 Medical Decision Making Amount and/or Complexity of Data Reviewed Labs: ordered. Radiology: ordered.  Risk Prescription drug management.   On initial evaluation patient is hemodynamically stable, afebrile and not in acute distress.  Patient does endorse AVH however does not appear to be responding to internal stimuli and denies SI and HI.  Differential diagnosis includes polysubstance use, metabolic encephalopathy secondary to infection, electrolyte abnormalities, hypothyroidism, hypoglycemia.  Will obtain laboratory studies and allow for a period of metabolization.  Will give Risperdal  orally for AVH.  Laboratory  studies reviewed without evidence of infection, anemia, severe electrolyte abnormalities, renal or kidney impairment.  TSH and free T4 within normal limits.  No evidence of coingestants.  At the time of signout patient's workup not completed.  Patient will need reevaluation after period of metabolization in order to assess for continued AVH.  Anticipate need for possible psychiatry consult pending continuation of AVH in the setting of polysubstance use.  CIWA obtained however patient does not appear to be withdrawing at this time.  Signout was given to oncoming team and all questions answered.     Final diagnoses:  None    ED Discharge Orders     None       Chroman Nada DO Emergency Medicine PGY2    Nada Chroman, DO 03/26/24  1540    Tonia Chew, MD 03/27/24 [phone]

## 2024-03-26 NOTE — Discharge Instructions (Signed)
 You were seen today for hallucinations. While you were here we monitored your vitals, preformed a physical exam, and labs and imaging. These were all reassuring and there is no indication for any further testing or intervention in the emergency department at this time.   Things to do:  - Follow up with your primary care provider within the next 1-2 weeks - Please follow-up with your primary psychiatrist and restart your medications.  I have written you new prescriptions.  Return to the emergency department if you have any new or worsening symptoms including thoughts of harming yourself or others, or if you have any other concerns.

## 2024-03-26 NOTE — ED Notes (Signed)
 Pt seen leaving ED

## 2024-03-26 NOTE — ED Triage Notes (Signed)
 Pt reports that she has been using crack and needs help.

## 2024-03-26 NOTE — ED Triage Notes (Signed)
 Pt requesting help with crack cocaine abuse. Last use was last night. Pt also c.o AH/VH, states she is seeing shadows and hearing voices telling her to kill other people. States she has been out of her meds lately. C.o SOB due to not having her asthma inhaler.

## 2024-03-26 NOTE — ED Provider Notes (Signed)
 Patient care assumed from previous provider.   Patient care of Jodi Kim is a 68 y.o. female from previous provider. Please see the original provider note from this emergency department encounter for full history and physical.   Course of Care and my assessment at the time of sign out is detailed in the ED Course below.   Clinical Course as of 03/26/24 1643  Mon Mar 26, 2024  1411 EKG reviewed by me: Normal sinus rhythm with normal axis and intervals.  Nonspecific ST and T wave changes [AG]  1456 Metabolize to reassess AVH-- does need detox resources at DC if DC'ed [AG]  1533 S- hothyroid, htn Polysubstance use Avh w/ cocaine yd and etoh 2d On riserpdal and seroquel  No SI HI [RC]  1552 COCAINE(!): POSITIVE [RC]       Clinical Course User Index [RC] Sharyne Darina RAMAN, MD    On reassessment patient has been having AVH for 6 months.  States she still having them now.  Resting comfortably in bed.  She denies any SI, HI or train of thought is linear.  She states that she would like to get back on her medication and get help if needed.  She reiterates that she does not have any thoughts of wanting to hurt herself.  Also reports that she has been off her medications for some time.  She does not appear to be responding to any internal stimuli and is resting very comfortably.  No agitation, pressured speech.  Behavior seems normal.  I believe she is acute danger to herself or others.  Given this I do not believe she requires emergent psychiatric evaluation.  She has been using drugs and we discussed this may make her symptoms worse that she should refrain.  Given this I believe appropriate for discharge with close follow-up with primary psychiatrist.  Provided refills of her Seroquel .  Discharged in stable condition with strict return precautions.  Labs reviewed by myself and considered in medical decision making.  Imaging reviewed by myself and considered in medical decision making. Imaging  final read interpreted by radiology.  1. Hallucinations     Discharge   The plan for this patient was discussed with Dr. Randol Simmonds, MD , who voiced agreement and who oversaw evaluation and treatment of this patient.    Sharyne Darina RAMAN, MD 03/26/24 1646    Randol Simmonds, MD 03/28/24 [phone]

## 2024-04-06 ENCOUNTER — Other Ambulatory Visit (HOSPITAL_BASED_OUTPATIENT_CLINIC_OR_DEPARTMENT_OTHER): Payer: Self-pay

## 2024-04-29 ENCOUNTER — Encounter (HOSPITAL_COMMUNITY): Payer: Self-pay

## 2024-04-29 ENCOUNTER — Other Ambulatory Visit (HOSPITAL_COMMUNITY)
Admission: EM | Admit: 2024-04-29 | Discharge: 2024-05-07 | Disposition: A | Discharge status: Home / Self Care | Payer: MEDICAID | Attending: Psychiatry | Admitting: Psychiatry

## 2024-04-29 ENCOUNTER — Ambulatory Visit (HOSPITAL_COMMUNITY)
Admission: EM | Admit: 2024-04-29 | Discharge: 2024-04-29 | Disposition: A | Discharge status: Other Acute Inpatient Hospital | Payer: MEDICAID | Attending: Family Medicine | Admitting: Family Medicine

## 2024-04-29 ENCOUNTER — Other Ambulatory Visit: Payer: Self-pay

## 2024-04-29 DIAGNOSIS — F1910 Other psychoactive substance abuse, uncomplicated: Secondary | ICD-10-CM

## 2024-04-29 DIAGNOSIS — F209 Schizophrenia, unspecified: Secondary | ICD-10-CM | POA: Insufficient documentation

## 2024-04-29 DIAGNOSIS — F1410 Cocaine abuse, uncomplicated: Secondary | ICD-10-CM | POA: Insufficient documentation

## 2024-04-29 DIAGNOSIS — Z91148 Patient's other noncompliance with medication regimen for other reason: Secondary | ICD-10-CM | POA: Insufficient documentation

## 2024-04-29 DIAGNOSIS — R9431 Abnormal electrocardiogram [ECG] [EKG]: Secondary | ICD-10-CM | POA: Insufficient documentation

## 2024-04-29 DIAGNOSIS — R45850 Homicidal ideations: Secondary | ICD-10-CM | POA: Insufficient documentation

## 2024-04-29 DIAGNOSIS — F39 Unspecified mood [affective] disorder: Secondary | ICD-10-CM | POA: Diagnosis not present

## 2024-04-29 DIAGNOSIS — M159 Polyosteoarthritis, unspecified: Secondary | ICD-10-CM | POA: Insufficient documentation

## 2024-04-29 DIAGNOSIS — E876 Hypokalemia: Secondary | ICD-10-CM | POA: Diagnosis not present

## 2024-04-29 DIAGNOSIS — Z62810 Personal history of physical and sexual abuse in childhood: Secondary | ICD-10-CM | POA: Insufficient documentation

## 2024-04-29 DIAGNOSIS — Z818 Family history of other mental and behavioral disorders: Secondary | ICD-10-CM | POA: Insufficient documentation

## 2024-04-29 DIAGNOSIS — I10 Essential (primary) hypertension: Secondary | ICD-10-CM | POA: Insufficient documentation

## 2024-04-29 DIAGNOSIS — Z79899 Other long term (current) drug therapy: Secondary | ICD-10-CM | POA: Insufficient documentation

## 2024-04-29 LAB — URINALYSIS, ROUTINE W REFLEX MICROSCOPIC
Bilirubin Urine: NEGATIVE
Glucose, UA: NEGATIVE mg/dL
Hgb urine dipstick: NEGATIVE
Ketones, ur: NEGATIVE mg/dL
Nitrite: NEGATIVE
Protein, ur: NEGATIVE mg/dL
Specific Gravity, Urine: 1.014 (ref 1.005–1.030)
pH: 5 (ref 5.0–8.0)

## 2024-04-29 LAB — CBC WITH DIFFERENTIAL/PLATELET
Abs Immature Granulocytes: 0.02 K/uL (ref 0.00–0.07)
Basophils Absolute: 0 K/uL (ref 0.0–0.1)
Basophils Relative: 1 %
Eosinophils Absolute: 0.1 K/uL (ref 0.0–0.5)
Eosinophils Relative: 2 %
HCT: 40.9 % (ref 36.0–46.0)
Hemoglobin: 13.6 g/dL (ref 12.0–15.0)
Immature Granulocytes: 0 %
Lymphocytes Relative: 21 %
Lymphs Abs: 1.4 K/uL (ref 0.7–4.0)
MCH: 31.8 pg (ref 26.0–34.0)
MCHC: 33.3 g/dL (ref 30.0–36.0)
MCV: 95.6 fL (ref 80.0–100.0)
Monocytes Absolute: 0.4 K/uL (ref 0.1–1.0)
Monocytes Relative: 6 %
Neutro Abs: 4.5 K/uL (ref 1.7–7.7)
Neutrophils Relative %: 70 %
Platelets: 346 K/uL (ref 150–400)
RBC: 4.28 MIL/uL (ref 3.87–5.11)
RDW: 12.8 % (ref 11.5–15.5)
WBC: 6.4 K/uL (ref 4.0–10.5)
nRBC: 0 % (ref 0.0–0.2)

## 2024-04-29 LAB — LIPID PANEL
Cholesterol: 182 mg/dL (ref 0–200)
HDL: 95 mg/dL (ref >40)
LDL Cholesterol: 67 mg/dL (ref 0–99)
Total CHOL/HDL Ratio: 1.9 ratio
Triglycerides: 100 mg/dL (ref <150)
VLDL: 20 mg/dL (ref 0–40)

## 2024-04-29 LAB — COMPREHENSIVE METABOLIC PANEL WITH GFR
ALT: 21 U/L (ref 0–44)
AST: 22 U/L (ref 15–41)
Albumin: 3.5 g/dL (ref 3.5–5.0)
Alkaline Phosphatase: 68 U/L (ref 38–126)
Anion gap: 13 (ref 5–15)
BUN: 12 mg/dL (ref 8–23)
CO2: 25 mmol/L (ref 22–32)
Calcium: 9 mg/dL (ref 8.9–10.3)
Chloride: 100 mmol/L (ref 98–111)
Creatinine, Ser: 0.75 mg/dL (ref 0.44–1.00)
GFR, Estimated: >60 mL/min (ref >60)
Glucose, Bld: 91 mg/dL (ref 70–99)
Potassium: 3.3 mmol/L — ABNORMAL LOW (ref 3.5–5.1)
Sodium: 138 mmol/L (ref 135–145)
Total Bilirubin: 0.6 mg/dL (ref 0.0–1.2)
Total Protein: 6.6 g/dL (ref 6.5–8.1)

## 2024-04-29 LAB — POCT URINE DRUG SCREEN - MANUAL ENTRY (I-SCREEN)
POC Amphetamine UR: NOT DETECTED
POC Buprenorphine (BUP): NOT DETECTED
POC Cocaine UR: POSITIVE — AB
POC Marijuana UR: NOT DETECTED
POC Methadone UR: NOT DETECTED
POC Methamphetamine UR: NOT DETECTED
POC Morphine: NOT DETECTED
POC Oxazepam (BZO): NOT DETECTED
POC Oxycodone UR: NOT DETECTED
POC Secobarbital (BAR): NOT DETECTED

## 2024-04-29 LAB — HEMOGLOBIN A1C
Hgb A1c MFr Bld: 5.6 % (ref 4.8–5.6)
Mean Plasma Glucose: 114.02 mg/dL

## 2024-04-29 LAB — ETHANOL: Alcohol, Ethyl (B): <15 mg/dL (ref <15)

## 2024-04-29 LAB — GLUCOSE, CAPILLARY: Glucose-Capillary: 113 mg/dL — ABNORMAL HIGH (ref 70–99)

## 2024-04-29 LAB — MAGNESIUM: Magnesium: 1.8 mg/dL (ref 1.7–2.4)

## 2024-04-29 LAB — TSH: TSH: 5.224 u[IU]/mL — ABNORMAL HIGH (ref 0.350–4.500)

## 2024-04-29 MED ORDER — OLANZAPINE 5 MG PO TBDP: 5.0000 mg | ORAL_TABLET | Freq: Three times a day (TID) | ORAL | Status: DC | PRN

## 2024-04-29 MED ORDER — ALBUTEROL SULFATE HFA 108 (90 BASE) MCG/ACT IN AERS
2.0000 | INHALATION_SPRAY | RESPIRATORY_TRACT | Status: DC | PRN
  Administered 2024-04-29 – 2024-05-06 (×10): 2 via RESPIRATORY_TRACT
  Filled 2024-04-29: qty 6.7

## 2024-04-29 MED ORDER — TRAZODONE HCL 50 MG PO TABS
50.0000 mg | ORAL_TABLET | Freq: Every evening | ORAL | Status: DC | PRN
  Administered 2024-04-29 – 2024-04-30 (×2): 50 mg via ORAL
  Filled 2024-04-29 (×2): qty 1

## 2024-04-29 MED ORDER — ALUM & MAG HYDROXIDE-SIMETH 200-200-20 MG/5ML PO SUSP: 30.0000 mL | ORAL | Status: DC | PRN

## 2024-04-29 MED ORDER — MAGNESIUM HYDROXIDE 400 MG/5ML PO SUSP: 30.0000 mL | Freq: Every day | ORAL | Status: DC | PRN

## 2024-04-29 MED ORDER — OLANZAPINE 10 MG IM SOLR: 5.0000 mg | Freq: Three times a day (TID) | INTRAMUSCULAR | Status: DC | PRN

## 2024-04-29 MED ORDER — CLONIDINE HCL 0.1 MG PO TABS
0.1000 mg | ORAL_TABLET | Freq: Once | ORAL | Status: AC
  Administered 2024-04-29: 0.1 mg via ORAL
  Filled 2024-04-29: qty 1

## 2024-04-29 MED ORDER — ACETAMINOPHEN 325 MG PO TABS
650.0000 mg | ORAL_TABLET | Freq: Four times a day (QID) | ORAL | Status: DC | PRN
  Administered 2024-04-29 – 2024-05-07 (×4): 650 mg via ORAL
  Filled 2024-04-29 (×4): qty 2

## 2024-04-29 MED ORDER — ACETAMINOPHEN 325 MG PO TABS: 650.0000 mg | ORAL_TABLET | Freq: Four times a day (QID) | ORAL | Status: DC | PRN

## 2024-04-29 NOTE — ED Notes (Signed)
 Pt asleep but easy to rouse at this hour. Manual BP checked and is 180/70. Pt states it is usually lower when she takes her medications. Provider notified. No apparent distress. RR even and unlabored. Monitored for safety.

## 2024-04-29 NOTE — Group Note (Signed)
 Group Topic: Relaxation  Group Date: 04/29/2024 Start Time: 1745 End Time: 1815 Facilitators: Sumedh Shinsato, Zane HERO, RN; Sherleen Primmer, RN  Department: [facility]  Number of Participants: 5  Group Focus: relaxation Treatment Modality:  Individual Therapy Interventions utilized were support Purpose: reinforce self-care, explore coping skills  Name: Jodi Kim Date of Birth: 1956/07/08  MR: [id number]    Level of Participation: patient did not attend group Quality of Participation:  Interactions with others:  Mood/Affect:  Triggers (if applicable):  Cognition:  Progress: None Response:  Plan: patient will be encouraged to attend future groups/programming on the unit  Patients Problems:  Patient Active Problem List   Diagnosis Date Noted   Polysubstance abuse (HCC) 04/29/2024
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
## 2024-04-29 NOTE — Group Note (Signed)
 Group Topic: Recovery Basics  Group Date: 04/29/2024 Start Time: 0100 End Time: 0200 Facilitators: Nicholaus Arlyne BIRCH, NT  Department: [facility]  Number of Participants: 7  Group Focus: coping skills Treatment Modality:  Psychoeducation Interventions utilized were problem solving Purpose: increase insight  Name: Jodi Kim Date of Birth: 1956/06/02  MR: [id number]    Level of Participation: Patient did not attend Quality of Participation: N/A Interactions with others: N/A Mood/Affect: N/A Triggers (if applicable): N/A Cognition: N/A Progress: None Response: N/A Plan: patient will be encouraged to Attend groups  Patients Problems:  Patient Active Problem List   Diagnosis Date Noted   Polysubstance abuse (HCC) 04/29/2024

## 2024-04-29 NOTE — ED Notes (Signed)
Pt in room at this hour. No apparent distress. RR even and unlabored. Monitored for safety.
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
## 2024-04-30 DIAGNOSIS — F39 Unspecified mood [affective] disorder: Secondary | ICD-10-CM | POA: Diagnosis not present

## 2024-04-30 DIAGNOSIS — F1410 Cocaine abuse, uncomplicated: Secondary | ICD-10-CM | POA: Diagnosis not present

## 2024-04-30 DIAGNOSIS — I10 Essential (primary) hypertension: Secondary | ICD-10-CM | POA: Diagnosis not present

## 2024-04-30 DIAGNOSIS — E876 Hypokalemia: Secondary | ICD-10-CM | POA: Diagnosis not present

## 2024-04-30 MED ORDER — POTASSIUM CHLORIDE CRYS ER 20 MEQ PO TBCR
40.0000 meq | EXTENDED_RELEASE_TABLET | Freq: Once | ORAL | Status: AC
  Administered 2024-04-30: 40 meq via ORAL
  Filled 2024-04-30: qty 2

## 2024-04-30 MED ORDER — AMLODIPINE BESYLATE 10 MG PO TABS
10.0000 mg | ORAL_TABLET | Freq: Every day | ORAL | Status: DC
  Administered 2024-04-30: 10 mg via ORAL
  Filled 2024-04-30: qty 1

## 2024-04-30 MED ORDER — RISPERIDONE 1 MG PO TBDP
1.0000 mg | ORAL_TABLET | Freq: Every day | ORAL | Status: DC
  Administered 2024-04-30 – 2024-05-04 (×5): 1 mg via ORAL
  Filled 2024-04-30 (×5): qty 1

## 2024-04-30 MED ORDER — ATORVASTATIN CALCIUM 40 MG PO TABS
40.0000 mg | ORAL_TABLET | Freq: Every day | ORAL | Status: AC
  Administered 2024-04-30 – 2024-05-06 (×7): 40 mg via ORAL
  Filled 2024-04-30 (×8): qty 1

## 2024-04-30 MED ORDER — RISPERIDONE 2 MG PO TBDP
2.0000 mg | ORAL_TABLET | Freq: Every day | ORAL | Status: DC
  Administered 2024-04-30 – 2024-05-01 (×2): 2 mg via ORAL
  Filled 2024-04-30 (×2): qty 1

## 2024-04-30 MED ORDER — AMLODIPINE BESYLATE 10 MG PO TABS
10.0000 mg | ORAL_TABLET | Freq: Every day | ORAL | Status: AC
  Administered 2024-05-01 – 2024-05-06 (×6): 10 mg via ORAL
  Filled 2024-04-30 (×5): qty 1

## 2024-04-30 MED ORDER — NAPROXEN 375 MG PO TABS
375.0000 mg | ORAL_TABLET | Freq: Two times a day (BID) | ORAL | Status: DC
  Administered 2024-04-30 – 2024-05-07 (×14): 375 mg via ORAL
  Filled 2024-04-30 (×14): qty 1

## 2024-04-30 NOTE — Group Note (Signed)
 Group Topic: Understanding Self  Group Date: 04/30/2024 Start Time: 1715 End Time: 1740 Facilitators: Daved Tinnie HERO, RN  Department: [facility]  Number of Participants: 7  Group Focus: self-awareness Treatment Modality:  Psychoeducation Interventions utilized were exploration and patient education Purpose: self exploration  Name: Jodi Kim Date of Birth: 05-16-1956  MR: [id number]    Level of Participation: active Quality of Participation: attentive and cooperative Interactions with others: gave feedback Mood/Affect: appropriate Triggers (if applicable): n/a Cognition: coherent/clear Progress: Gaining insight Response: pt says they will maintain or improve their values by attending supportive classes and utilizing resources  Plan: patient will be encouraged to attend future RN education groups  Patients Problems:  Patient Active Problem List   Diagnosis Date Noted   Polysubstance abuse (HCC) 04/29/2024
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
## 2024-04-30 NOTE — ED Notes (Signed)
 Patient is in the Dayroom calm and composed watching TV with other patients. NAD. Denies any SI/HI/AVH. Respirations are even and unlabored.  Environment clean and secured per policy. Will monitor for safety.

## 2024-04-30 NOTE — Group Note (Signed)
 Group Topic: Wellness  Group Date: 04/30/2024 Start Time: 8069 End Time: 2000 Facilitators: Anice Benton LABOR, NT  Department: [facility]  Number of Participants: 3  Group Focus: check in Treatment Modality:  Individual Therapy Interventions utilized were support Purpose: express feelings  Name: Jodi Kim Date of Birth: 08/15/1955  MR: [id number]    Level of Participation: active Quality of Participation: cooperative Interactions with others: gave feedback Mood/Affect: appropriate Triggers (if applicable): N/A Cognition: insightful Progress: Moderate Response: Good Plan: follow-up needed  Patients Problems:  Patient Active Problem List   Diagnosis Date Noted   Polysubstance abuse (HCC) 04/29/2024

## 2024-04-30 NOTE — Care Management (Signed)
 [facility] Care Management   Writer met with the patient and discussed discharge planning.  Patient requests inpatient substance abuse treatment.   Writer referred patient to [facility], [facility] and [facility].   3pm  Patient was declined at [facility] due to having Medicare and Medicaid insurance.   Writer referred patient to [facility] .  Patient will need to complete a telephone intake phone interview [phone] with [facility].

## 2024-04-30 NOTE — ED Notes (Signed)
 Pt asleep at this hour. No apparent distress. RR even and unlabored. Monitored for safety.

## 2024-04-30 NOTE — ED Notes (Signed)
 Psych meds and antihypertension meds not restarted

## 2024-05-01 DIAGNOSIS — E876 Hypokalemia: Secondary | ICD-10-CM | POA: Diagnosis not present

## 2024-05-01 DIAGNOSIS — I10 Essential (primary) hypertension: Secondary | ICD-10-CM | POA: Diagnosis not present

## 2024-05-01 DIAGNOSIS — F39 Unspecified mood [affective] disorder: Secondary | ICD-10-CM | POA: Diagnosis not present

## 2024-05-01 DIAGNOSIS — F1410 Cocaine abuse, uncomplicated: Secondary | ICD-10-CM | POA: Diagnosis not present

## 2024-05-01 LAB — POTASSIUM: Potassium: 3.6 mmol/L (ref 3.5–5.1)

## 2024-05-01 MED ORDER — FLUTICASONE PROPIONATE HFA 44 MCG/ACT IN AERO
2.0000 | INHALATION_SPRAY | Freq: Two times a day (BID) | RESPIRATORY_TRACT | Status: AC
  Administered 2024-05-01 – 2024-05-03 (×4): 2 via RESPIRATORY_TRACT
  Filled 2024-05-01: qty 10.6

## 2024-05-01 MED ORDER — TRAZODONE HCL 50 MG PO TABS
50.0000 mg | ORAL_TABLET | Freq: Every day | ORAL | Status: DC
  Administered 2024-05-01 – 2024-05-03 (×3): 50 mg via ORAL
  Filled 2024-05-01 (×3): qty 1

## 2024-05-01 MED ORDER — SPIRONOLACTONE 25 MG PO TABS
25.0000 mg | ORAL_TABLET | Freq: Every day | ORAL | Status: AC
  Administered 2024-05-01 – 2024-05-07 (×7): 25 mg via ORAL
  Filled 2024-05-01 (×7): qty 1

## 2024-05-01 NOTE — ED Notes (Signed)
 Patient is in the bedroom calm and sleeping. NAD. Will continue to monitor for safety.

## 2024-05-01 NOTE — ED Notes (Signed)
 Patient awake and alert on unit.  She is eating lunch and is without issue or complaint.  No distress.  Patient is calm and pleasant.  Makes needs known to staff.  Will monitor.

## 2024-05-01 NOTE — ED Notes (Signed)
Patient asleep at this time. NAD.

## 2024-05-01 NOTE — Group Note (Signed)
 Group Topic: Healthy Self Image and Positive Change  Group Date: 05/01/2024 Start Time: 1630 End Time: 1700 Facilitators: Elnor Keven SAILOR  Department: [facility]  Number of Participants: 8  Group Focus: affirmation Treatment Modality:  Psychoeducation Interventions utilized were mental fitness. Positive affirmations Purpose: enhance coping skills  Name: Jodi Kim Date of Birth: 08-Jun-1956  MR: [id number]    Level of Participation: active Quality of Participation: attentive Interactions with others: gave feedback Mood/Affect: appropriate Triggers (if applicable): NA Cognition: coherent/clear Progress: Moderate Response: Grateful for being alive and being here. Her positive self affirmation is I'm gonna keep going Plan: follow-up needed  Patients Problems:  Patient Active Problem List   Diagnosis Date Noted   Polysubstance abuse (HCC) 04/29/2024

## 2024-05-01 NOTE — Group Note (Signed)
 Group Topic: Overcoming Obstacles  Group Date: 05/01/2024 Start Time: 1015 End Time: 1100 Facilitators: Alyse Leilani LABOR, NT  Department: [facility]  Number of Participants: 8  Group Focus: abuse issues, coping skills, and safety plan Treatment Modality:  Interpersonal Therapy Interventions utilized were patient education, problem solving, and reality testing Purpose: enhance coping skills, express feelings, and regain self-worth  Name: Lynae Pederson Date of Birth: 1956-07-04  MR: [id number]    Level of Participation: active Quality of Participation: engaged and supportive Interactions with others: gave feedback Mood/Affect: positive Triggers (if applicable): none Cognition: insightful Progress: Gaining insight Response: none Plan: patient will be encouraged to keep attending groups.  Patients Problems:  Patient Active Problem List   Diagnosis Date Noted   Polysubstance abuse (HCC) 04/29/2024

## 2024-05-01 NOTE — Care Management (Signed)
 [facility] Care Management   Declined - [facility] she was declined due to her insurance.   RTSA - Due to her her Medicare part A insurance.   [facility] - Keisha intake worker reports that the patient needs to complete a pre screen.  The number is [phone].

## 2024-05-01 NOTE — Progress Notes (Signed)
 Meal provided
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
## 2024-05-01 NOTE — [facility] Group Notes (Signed)
 Spirituality Group   Group Goal: Support / Education around grief and loss    Group Description: Following introductions and group rules, group members engaged in facilitated group dialog and support around topic of loss, with particular support around experiences of loss in their lives. Group members identified types of loss (relationships / self / things) as well as patterns, circumstances, and changes that precipitate loss. Reflection invited on thoughts / feelings around loss, normalized grief responses, and recognized variety in grief experience. Group noted Worden's four tasks of grief in discussion. Group drew on Adlerian / Rogerian, narrative, MI, with Yalom's group therapy as a primary framework.   Observations: Jodi Kim was quiet but seemed passively engaged in the group discussion.  [name] L. [name].Div

## 2024-05-01 NOTE — Group Note (Signed)
 Group Topic: Recovery Basics  Group Date: 05/01/2024 Start Time: 1205 End Time: 1230 Facilitators: Stanly Stabile, RN  Department: [facility]  Number of Participants: 7  Group Focus: chemical dependency education, chemical dependency issues, and coping skills Treatment Modality:  Behavior Modification Therapy Interventions utilized were clarification, group exercise, mental fitness, patient education, and problem solving Purpose: enhance coping skills, explore maladaptive thinking, express feelings, express irrational fears, improve communication skills, increase insight, and relapse prevention strategies  Name: Jodi Kim Date of Birth: [birth year]  MR: [id number]    Level of Participation: moderate Quality of Participation: attentive and cooperative Interactions with others: gave feedback Mood/Affect: appropriate Triggers (if applicable):   Cognition: coherent/clear and goal directed Progress: Moderate Response:   Plan: follow-up needed  Patients Problems:  Patient Active Problem List   Diagnosis Date Noted   Polysubstance abuse (HCC) 04/29/2024

## 2024-05-01 NOTE — Progress Notes (Signed)
 Meal given

## 2024-05-02 DIAGNOSIS — E876 Hypokalemia: Secondary | ICD-10-CM | POA: Diagnosis not present

## 2024-05-02 DIAGNOSIS — F39 Unspecified mood [affective] disorder: Secondary | ICD-10-CM | POA: Diagnosis not present

## 2024-05-02 DIAGNOSIS — I10 Essential (primary) hypertension: Secondary | ICD-10-CM | POA: Diagnosis not present

## 2024-05-02 DIAGNOSIS — F1410 Cocaine abuse, uncomplicated: Secondary | ICD-10-CM | POA: Diagnosis not present

## 2024-05-02 MED ORDER — RISPERIDONE 2 MG PO TBDP
3.0000 mg | ORAL_TABLET | Freq: Every day | ORAL | Status: DC
  Administered 2024-05-02 – 2024-05-04 (×3): 3 mg via ORAL
  Filled 2024-05-02 (×3): qty 1

## 2024-05-02 NOTE — Group Note (Signed)
 Group Topic: Balance in Life  Group Date: 05/02/2024 Start Time: 1930 End Time: 2000 Facilitators: Carollynn Genre, NT  Department: [facility]  Number of Participants: 5  Group Focus: check in Treatment Modality:  Individual Therapy Interventions utilized were group exercise Purpose: reinforce self-care  Name: Jodi Kim Date of Birth: 24-Feb-1956  MR: [id number]    Level of Participation Did not atTend Quality of Participation: Interactions with others: na Mood/Affect: N/A Triggers (if applicable): na Cognition: N/A Progress: None Response: n/A Plan: patient will be encouraged to ATTEND GROUP Patients Problems:  Patient Active Problem List   Diagnosis Date Noted   Polysubstance abuse (HCC) 04/29/2024

## 2024-05-02 NOTE — ED Notes (Signed)
 Pt sitting in dayroom watching television and interacting with peers. No acute distress noted. No concerns voiced. Informed pt to notify staff with any needs or assistance. Pt verbalized understanding and agreement. Will continue to monitor for safety.

## 2024-05-02 NOTE — ED Notes (Signed)
 Patient is in the bedroom sleeping, NAD. Will continue to monitor for safety.

## 2024-05-02 NOTE — ED Notes (Signed)
 Patient A&Ox4. Denies intent to harm self/others when asked. Denies A/VH. Patient denies any physical complaints when asked. No acute distress noted. Support and encouragement provided. Routine safety checks conducted according to facility protocol. Encouraged patient to notify staff if thoughts of harm toward self or others arise. Patient verbalize understanding and agreement. Will continue to monitor for safety.

## 2024-05-02 NOTE — Group Note (Signed)
 Group Topic: Communication  Group Date: 05/02/2024 Start Time: 2000 End Time: 2100 Facilitators: Joan Plowman B  Department: [facility]  Number of Participants: 4  Group Focus: communication and goals/reality orientation Treatment Modality:  Individual Therapy and Interpersonal Therapy Interventions utilized were leisure development and support Purpose: express feelings and improve communication skills  Name: Jodi Kim Date of Birth: 04/28/1956  MR: [id number]    Level of Participation: active Quality of Participation: cooperative Interactions with others: gave feedback Mood/Affect: appropriate Triggers (if applicable): NA Cognition: coherent/clear Progress: Gaining insight Response: NA Plan: patient will be encouraged to keep going to groups  Patients Problems:  Patient Active Problem List   Diagnosis Date Noted   Polysubstance abuse (HCC) 04/29/2024

## 2024-05-02 NOTE — ED Notes (Signed)
 Patient is in the Dayroom calm and composed watching TV with other patients. NAD. Respirations even and unlabored. Environment is secured per policy. Will continue to monitor for safety.
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
## 2024-05-02 NOTE — ED Notes (Signed)
 Pt is sleeping, no acute distress noted. Q15 safety checks in place.

## 2024-05-02 NOTE — Group Note (Signed)
 Group Topic: Relapse and Recovery  Group Date: 05/02/2024 Start Time: 1230 End Time: 1300 Facilitators: Stanly Stabile, RN  Department: [facility]  Number of Participants: 6  Group Focus: chemical dependency education and chemical dependency issues Treatment Modality:  Behavior Modification Therapy Interventions utilized were clarification, exploration, and patient education Purpose: enhance coping skills, explore maladaptive thinking, express irrational fears, increase insight, regain self-worth, and relapse prevention strategies  Name: Gayleen Sholtz Date of Birth: Jan 04, 1956  MR: [id number]    Level of Participation: minimal Quality of Participation: attentive Interactions with others: gave feedback Mood/Affect: appropriate Triggers (if applicable):   Cognition: goal directed Progress: Gaining insight Response:   Plan: follow-up needed  Patients Problems:  Patient Active Problem List   Diagnosis Date Noted   Polysubstance abuse (HCC) 04/29/2024

## 2024-05-02 NOTE — ED Notes (Signed)
 Pt sleeping in no acute distress. RR even and unlabored. Environment secured. Will continue to monitor for safety.

## 2024-05-02 NOTE — Care Management (Addendum)
 [facility] Care Management   Patient completed her telephone intake screening with Joen at [facility].  Writer is awaiting a call back from the clinical team with a decision on placement.   11:17am  Writer provided patient with the phone number to the [facility] due to the patient being homeless.  Patient reports that she was not able to speak to anyone at the shelter and she was only able to leave a message.   Declined - [facility], per Nanetta the patient needs a higher level of care at [facility] in [facility].   Writer reminded patient that she will need to speak to the [facility] intake in order to participate in there program.   The number to the [employer] is [phone].  3:30pm  Writer faxed referral to [facility] in [place] [facility].  Writer lkeft a HIPAA compliant voice mail message to confirm that the fax has been received.

## 2024-05-03 DIAGNOSIS — E876 Hypokalemia: Secondary | ICD-10-CM | POA: Diagnosis not present

## 2024-05-03 DIAGNOSIS — F39 Unspecified mood [affective] disorder: Secondary | ICD-10-CM | POA: Diagnosis not present

## 2024-05-03 DIAGNOSIS — I10 Essential (primary) hypertension: Secondary | ICD-10-CM | POA: Diagnosis not present

## 2024-05-03 DIAGNOSIS — F1410 Cocaine abuse, uncomplicated: Secondary | ICD-10-CM | POA: Diagnosis not present

## 2024-05-03 NOTE — ED Notes (Signed)
Patient asleep NAD

## 2024-05-03 NOTE — ED Notes (Signed)
 Patient A&Ox4. Denies intent to harm self/others when asked. Denies A/VH. Patient denies any physical complaints when asked. Pt complain of having same nightmare every night where she is fighting with all the drug dealers. Support and encouragement provided. Encouraged pt to speak about nighmares with provider. Pt verbalized agreement. Routine safety checks conducted according to facility protocol. Encouraged patient to notify staff if thoughts of harm toward self or others arise. Patient verbalize understanding and agreement. Will continue to monitor for safety.

## 2024-05-03 NOTE — ED Notes (Signed)
 Pt sleeping in no acute distress. RR even and unlabored. Environment secured. Will continue to monitor for safety.

## 2024-05-03 NOTE — ED Notes (Signed)
Pt A&O x 4, no distress noted., calm & cooperative, monitoring for safety. 

## 2024-05-03 NOTE — Group Note (Signed)
 Group Topic: Recovery Basics  Group Date: 05/03/2024 Start Time: 1315 End Time: 1345 Facilitators: Laneta Renea POUR, NT  Department: [facility]  Number of Participants: 4  Group Focus: psychiatric education Treatment Modality:  Psychoeducation Interventions utilized were patient education and problem solving Purpose: enhance coping skills and increase insight  Name: Jodi Kim Date of Birth: [birth year]  MR: [id number]    Level of Participation: minimal Quality of Participation: cooperative and engaged Interactions with others: gave feedback Mood/Affect: appropriate and bright Triggers (if applicable): none Cognition: coherent/clear and insightful Progress: Gaining insight Response: I am just going back in the same environment, but I want change.  Plan: patient will be encouraged to in group activities.   Patients Problems:  Patient Active Problem List   Diagnosis Date Noted   Polysubstance abuse (HCC) 04/29/2024

## 2024-05-03 NOTE — ED Provider Notes (Addendum)
 Behavioral Health Progress Note  Date and Time: 05/03/2024 4:50 PM Name: Jodi Kim MRN:  [id number]  Subjective: Jodi Kim is a 68 year old female patient with a reported history of schizophrenia insomnia, and cocaine use disorder who was admitted to the [facility] with complaints of cocaine use. UDS positive for cocaine. BAL negative.  On evaluation, patient is alert and oriented x 4. Thought process is linear and goal oriented  Thought content is negative for SI/HI/AVH and positive for paranoia. Objectively, no signs of acute psychosis. Her speech is clear and coherent. Her mood is dysphoric and affect is congruent and tearful. She is calm and cooperative and does not appear to be in acute distress. Patient reports that she has been using cocaine for a long time over 10+ years. She reports using cocaine everyday, an unknown quantity and states that people at the rooming house where she lives gives her the drugs. She also reports drinking alcohol 2-3 beers per day. She denies alcohol withdrawal symptoms, cravings or history of delirium tremens or seizures. She reports poor sleep sleep last night and states that she was having bad dreams about using drugs. She states that she woke up and had to pray. She endorses paranoia thoughts of feeling like the people here were sent to watch her and following her. Patient reports ongoing depression. PHQ-9 score is 16. She denies physical complaints. She denies medication side effects.   Diagnosis:  Final diagnoses:  Cocaine abuse (HCC)  Unspecified mood (affective) disorder  Essential hypertension    Total Time spent with patient: 30 minutes  Past Psychiatric History: Schizophrenia per pt report  Hospitalizations: [facility] x 2, Rehab in [place] in [place], [place] and [place], [place]  Past Medical History: HTN, DM, GERD, Bronchitis, asthma  PCP: Dr Benjamine [address]  Substance Use: Tobacco half ppd; ETOH 4-5  40 oz of beer, half pint of liquor, 2 cups of wine. Last use @ 0500 04/29/2024; Cocaine daily use of crack with last use @ 0500 04/29/2024; denies methamphetamine use sometimes it's in the crack; denies heroin or fentanyl use. 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 [place]  Current Medications:  Current Facility-Administered Medications  Medication Dose Route Frequency Provider Last Rate Last Admin   acetaminophen  (TYLENOL ) tablet 650 mg  650 mg Oral Q6H PRN Hobson, [name], NP   650 mg at 04/30/24 0923   albuterol  (VENTOLIN  HFA) 108 (90 Base) MCG/ACT inhaler 2 puff  2 puff Inhalation Q4H PRN Gottfried, Rhoda J, MD   2 puff at 05/01/24 1927   alum & mag hydroxide-simeth (MAALOX/MYLANTA) 200-200-20 MG/5ML suspension 30 mL  30 mL Oral Q4H PRN Hobson, [name], NP       amLODipine  (NORVASC ) tablet 10 mg  10 mg Oral Daily [name], NP   10 mg at 05/03/24 0900   atorvastatin  (LIPITOR) tablet 40 mg  40 mg Oral Daily [name], NP   40 mg at 05/02/24 1711   magnesium  hydroxide (MILK OF MAGNESIA) suspension 30 mL  30 mL Oral Daily PRN Hobson, [name], NP       naproxen  (NAPROSYN ) tablet 375 mg  375 mg Oral BID WC [name], NP   375 mg at 05/03/24 0900   OLANZapine  (ZYPREXA ) injection 5  mg  5 mg Intramuscular TID PRN Hobson, [name], NP       OLANZapine  zydis (ZYPREXA ) disintegrating tablet 5 mg  5 mg Oral TID PRN Hobson, [name], NP       risperiDONE  (RISPERDAL  M-TABS) disintegrating tablet 1 mg  1 mg Oral Daily Harris, Kimberly S, NP   1 mg at 05/03/24 0940   risperiDONE  (RISPERDAL  M-TABS) disintegrating tablet 3 mg  3 mg Oral QHS [name], NP   3 mg at 05/02/24 2110   spironolactone  (ALDACTONE ) tablet 25 mg  25 mg Oral Daily [name], NP   25 mg at 05/03/24  0940   traZODone  (DESYREL ) tablet 50 mg  50 mg Oral QHS [name], NP   50 mg at 05/02/24 2110   Current Outpatient Medications  Medication Sig Dispense Refill   albuterol  (VENTOLIN  HFA) 108 (90 Base) MCG/ACT inhaler Inhale 2 puffs into the lungs every 6 (six) hours as needed for wheezing.      Labs  Lab Results:  Admission on 04/29/2024  Component Date Value Ref Range Status   Potassium 05/01/2024 3.6  3.5 - 5.1 mmol/L Final   Performed at [facility], [address], [place], [place] [address]  Admission on 04/29/2024, Discharged on 04/29/2024  Component Date Value Ref Range Status   WBC 04/29/2024 6.4  4.0 - 10.5 K/uL Final   RBC 04/29/2024 4.28  3.87 - 5.11 MIL/uL Final   Hemoglobin 04/29/2024 13.6  12.0 - 15.0 g/dL Final   HCT 90/78/7974 40.9  36.0 - 46.0 % Final   MCV 04/29/2024 95.6  80.0 - 100.0 fL Final   MCH 04/29/2024 31.8  26.0 - 34.0 pg Final   MCHC 04/29/2024 33.3  30.0 - 36.0 g/dL Final   RDW 90/78/7974 12.8  11.5 - 15.5 % Final   Platelets 04/29/2024 346  150 - 400 K/uL Final   nRBC 04/29/2024 0.0  0.0 - 0.2 % Final   Neutrophils Relative % 04/29/2024 70  % Final   Neutro Abs 04/29/2024 4.5  1.7 - 7.7 K/uL Final   Lymphocytes Relative 04/29/2024 21  % Final   Lymphs Abs 04/29/2024 1.4  0.7 - 4.0 K/uL Final   Monocytes Relative 04/29/2024 6  % Final   Monocytes Absolute 04/29/2024 0.4  0.1 - 1.0 K/uL Final   Eosinophils Relative 04/29/2024 2  % Final   Eosinophils Absolute 04/29/2024 0.1  0.0 - 0.5 K/uL Final   Basophils Relative 04/29/2024 1  % Final   Basophils Absolute 04/29/2024 0.0  0.0 - 0.1 K/uL Final   Immature Granulocytes 04/29/2024 0  % Final   Abs Immature Granulocytes 04/29/2024 0.02  0.00 - 0.07 K/uL Final   Performed at [facility] Lab, [address], [place], [place] [address]   Sodium 04/29/2024 138  135 - 145 mmol/L Final   Potassium 04/29/2024 3.3 (L)  3.5 - 5.1 mmol/L Final   Chloride 04/29/2024 100  98 - 111 mmol/L Final    CO2 04/29/2024 25  22 - 32 mmol/L Final   Glucose, Bld 04/29/2024 91  70 - 99 mg/dL Final   Glucose reference range applies only to samples taken after fasting for at least 8 hours.   BUN 04/29/2024 12  8 - 23 mg/dL Final   Creatinine, Ser 04/29/2024 0.75  0.44 - 1.00 mg/dL Final   Calcium  04/29/2024 9.0  8.9 - 10.3 mg/dL Final   Total Protein 90/78/7974 6.6  6.5 -  8.1 g/dL Final   Albumin 90/78/7974 3.5  3.5 - 5.0 g/dL Final   AST 90/78/7974 22  15 - 41 U/L Final   ALT 04/29/2024 21  0 - 44 U/L Final   Alkaline Phosphatase 04/29/2024 68  38 - 126 U/L Final   Total Bilirubin 04/29/2024 0.6  0.0 - 1.2 mg/dL Final   GFR, Estimated 04/29/2024 >60  >60 mL/min Final   Comment: (NOTE) Calculated using the CKD-EPI Creatinine Equation (2021)    Anion gap 04/29/2024 13  5 - 15 Final   Performed at [facility], [address], [place], [place] [address]   Hgb A1c MFr Bld 04/29/2024 5.6  4.8 - 5.6 % Final   Comment: (NOTE) Diagnosis of Diabetes The following HbA1c ranges recommended by the American Diabetes Association (ADA) may be used as an aid in the diagnosis of diabetes mellitus.  Hemoglobin             Suggested A1C NGSP%              Diagnosis  <5.7                   Non Diabetic  5.7-6.4                Pre-Diabetic  >6.4                   Diabetic  <7.0                   Glycemic control for                       adults with diabetes.     Mean Plasma Glucose 04/29/2024 114.02  mg/dL Final   Performed at [facility], [address], [place], [place] [address]   Magnesium  04/29/2024 1.8  1.7 - 2.4 mg/dL Final   Performed at [facility], [address], [place], [place] [address]   Alcohol, Ethyl (B) 04/29/2024 <15  <15 mg/dL Final   Comment: (NOTE) For medical purposes only. Performed at [facility], [address], [place], [place] [address]    Cholesterol 04/29/2024 182  0 - 200 mg/dL Final   Triglycerides 90/78/7974 100  <150 mg/dL Final    HDL 90/78/7974 95  >40 mg/dL Final   Total CHOL/HDL Ratio 04/29/2024 1.9  RATIO Final   VLDL 04/29/2024 20  0 - 40 mg/dL Final   LDL Cholesterol 04/29/2024 67  0 - 99 mg/dL Final   Comment:        Total Cholesterol/HDL:CHD Risk Coronary Heart Disease Risk Table                     Men   Women  1/2 Average Risk   3.4   3.3  Average Risk       5.0   4.4  2 X Average Risk   9.6   7.1  3 X Average Risk  23.4   11.0        Use the calculated Patient Ratio above and the CHD Risk Table to determine the patient's CHD Risk.        ATP III CLASSIFICATION (LDL):  <100     mg/dL   Optimal  899-870  mg/dL   Near or Above  Optimal  130-159  mg/dL   Borderline  839-810  mg/dL   High  >809     mg/dL   Very High Performed at [facility], [address] [place], [place] [address]    TSH 04/29/2024 5.224 (H)  0.350 - 4.500 uIU/mL Final   Comment: Performed by a 3rd Generation assay with a functional sensitivity of <=0.01 uIU/mL. Performed at [facility], [address], [place], [place] [address]    Color, Urine 04/29/2024 YELLOW  YELLOW Final   APPearance 04/29/2024 HAZY (A)  CLEAR Final   Specific Gravity, Urine 04/29/2024 1.014  1.005 - 1.030 Final   pH 04/29/2024 5.0  5.0 - 8.0 Final   Glucose, UA 04/29/2024 NEGATIVE  NEGATIVE mg/dL Final   Hgb urine dipstick 04/29/2024 NEGATIVE  NEGATIVE Final   Bilirubin Urine 04/29/2024 NEGATIVE  NEGATIVE Final   Ketones, ur 04/29/2024 NEGATIVE  NEGATIVE mg/dL Final   Protein, ur 90/78/7974 NEGATIVE  NEGATIVE mg/dL Final   Nitrite 90/78/7974 NEGATIVE  NEGATIVE Final   Leukocytes,Ua 04/29/2024 TRACE (A)  NEGATIVE Final   RBC / HPF 04/29/2024 0-5  0 - 5 RBC/hpf Final   WBC, UA 04/29/2024 0-5  0 - 5 WBC/hpf Final   Bacteria, UA 04/29/2024 MANY (A)  NONE SEEN Final   Squamous Epithelial / HPF 04/29/2024 0-5  0 - 5 /HPF Final   Hyaline Casts, UA 04/29/2024 PRESENT   Final   Performed at [facility], [address], [place], [place] [address]   POC Amphetamine UR 04/29/2024 None Detected  NONE DETECTED (Cut Off Level 1000 ng/mL) Final   POC Secobarbital (BAR) 04/29/2024 None Detected  NONE DETECTED (Cut Off Level 300 ng/mL) Final   POC Buprenorphine (BUP) 04/29/2024 None Detected  NONE DETECTED (Cut Off Level 10 ng/mL) Final   POC Oxazepam (BZO) 04/29/2024 None Detected  NONE DETECTED (Cut Off Level 300 ng/mL) Final   POC Cocaine UR 04/29/2024 Positive (A)  NONE DETECTED (Cut Off Level 300 ng/mL) Final   POC Methamphetamine UR 04/29/2024 None Detected  NONE DETECTED (Cut Off Level 1000 ng/mL) Final   POC Morphine  04/29/2024 None Detected  NONE DETECTED (Cut Off Level 300 ng/mL) Final   POC Methadone UR 04/29/2024 None Detected  NONE DETECTED (Cut Off Level 300 ng/mL) Final   POC Oxycodone UR 04/29/2024 None Detected  NONE DETECTED (Cut Off Level 100 ng/mL) Final   POC Marijuana UR 04/29/2024 None Detected  NONE DETECTED (Cut Off Level 50 ng/mL) Final   Glucose-Capillary 04/29/2024 113 (H)  70 - 99 mg/dL Final   Glucose reference range applies only to samples taken after fasting for at least 8 hours.  Admission on 03/26/2024, Discharged on 03/26/2024  Component Date Value Ref Range Status   Sodium 03/26/2024 143  135 - 145 mmol/L Final   Potassium 03/26/2024 4.2  3.5 - 5.1 mmol/L Final   Chloride 03/26/2024 108  98 - 111 mmol/L Final   CO2 03/26/2024 23  22 - 32 mmol/L Final   Glucose, Bld 03/26/2024 130 (H)  70 - 99 mg/dL Final   Glucose reference range applies only to samples taken after fasting for at least 8 hours.   BUN 03/26/2024 12  8 - 23 mg/dL Final   Creatinine, Ser 03/26/2024 0.73  0.44 - 1.00 mg/dL Final   Calcium  03/26/2024 9.1  8.9 - 10.3 mg/dL Final   Total Protein 91/81/7974 6.3 (L)  6.5 - 8.1 g/dL Final   Albumin  03/26/2024 3.4 (L)  3.5 - 5.0 g/dL Final   AST 91/81/7974 27  15 - 41 U/L Final   ALT 03/26/2024 30  0 - 44 U/L Final   Alkaline Phosphatase 03/26/2024 63  38 - 126 U/L  Final   Total Bilirubin 03/26/2024 0.3  0.0 - 1.2 mg/dL Final   GFR, Estimated 03/26/2024 >60  >60 mL/min Final   Comment: (NOTE) Calculated using the CKD-EPI Creatinine Equation (2021)    Anion gap 03/26/2024 12  5 - 15 Final   Performed at [facility], [address], [place], [place] [address]   Alcohol, Ethyl (B) 03/26/2024 <15  <15 mg/dL Final   Comment: (NOTE) For medical purposes only. Performed at [facility], [address] [place], [place] [address]    WBC 03/26/2024 6.7  4.0 - 10.5 K/uL Final   RBC 03/26/2024 4.51  3.87 - 5.11 MIL/uL Final   Hemoglobin 03/26/2024 14.6  12.0 - 15.0 g/dL Final   HCT 91/81/7974 46.2 (H)  36.0 - 46.0 % Final   MCV 03/26/2024 102.4 (H)  80.0 - 100.0 fL Final   MCH 03/26/2024 32.4  26.0 - 34.0 pg Final   MCHC 03/26/2024 31.6  30.0 - 36.0 g/dL Final   RDW 91/81/7974 12.9  11.5 - 15.5 % Final   Platelets 03/26/2024 263  150 - 400 K/uL Final   nRBC 03/26/2024 0.0  0.0 - 0.2 % Final   Performed at [facility], [address] [place], [place] [address]   Opiates 03/26/2024 NONE DETECTED  NONE DETECTED Final   Cocaine 03/26/2024 POSITIVE (A)  NONE DETECTED Final   Benzodiazepines 03/26/2024 NONE DETECTED  NONE DETECTED Final   Amphetamines 03/26/2024 NONE DETECTED  NONE DETECTED Final   Tetrahydrocannabinol 03/26/2024 NONE DETECTED  NONE DETECTED Final   Barbiturates 03/26/2024 NONE DETECTED  NONE DETECTED Final   Comment: (NOTE) DRUG SCREEN FOR MEDICAL PURPOSES ONLY.  IF CONFIRMATION IS NEEDED FOR ANY PURPOSE, NOTIFY LAB WITHIN 5 DAYS.  LOWEST DETECTABLE LIMITS FOR URINE DRUG SCREEN Drug Class                     Cutoff (ng/mL) Amphetamine and metabolites    1000 Barbiturate and metabolites    200 Benzodiazepine                 200 Opiates and metabolites        300 Cocaine and metabolites        300 THC                            50 Performed at [facility], [address] [place], [place] [address]     TSH 03/26/2024 4.326  0.350 - 4.500 uIU/mL Final   Comment: Performed by a 3rd Generation assay with a functional sensitivity of <=0.01 uIU/mL. Performed at [facility], [address] [place], [place] [address]    Free T4 03/26/2024 0.75  0.61 - 1.12 ng/dL Final   Comment: (NOTE) Biotin ingestion may interfere with free T4 tests. If the results are inconsistent with the TSH level, previous test results, or the clinical presentation, then consider biotin interference. If needed, order repeat testing after stopping biotin. Performed at [facility], [address] [place], [place] [address]    Salicylate Lvl 03/26/2024 <7.0 (L)  7.0 - 30.0 mg/dL Final   Performed  at [facility], [address], [place], [place] [address]   Acetaminophen  (Tylenol ), Serum 03/26/2024 <10 (L)  10 - 30 ug/mL Final   Comment: (NOTE) Therapeutic concentrations vary significantly. A range of 10-30 ug/mL  may be an effective concentration for many patients. However, some  are best treated at concentrations outside of this range. Acetaminophen  concentrations >150 ug/mL at 4 hours after ingestion  and >50 ug/mL at 12 hours after ingestion are often associated with  toxic reactions.  Performed at [facility], [address] [place], [place] [address]     Blood Alcohol level:  Lab Results  Component Value Date   [facility] <15 04/29/2024   ETH <15 03/26/2024    Metabolic Disorder Labs: Lab Results  Component Value Date   HGBA1C 5.6 04/29/2024   MPG 114.02 04/29/2024   No results found for: PROLACTIN Lab Results  Component Value Date   CHOL 182 04/29/2024   TRIG 100 04/29/2024   HDL 95 04/29/2024   CHOLHDL 1.9 04/29/2024   VLDL 20 04/29/2024   LDLCALC 67 04/29/2024    Therapeutic Lab Levels:  Physical Findings   AUDIT    Flowsheet Row ED from 04/29/2024 in [facility]  Alcohol Use Disorder Identification Test Final Score (AUDIT) 26    Flowsheet Row ED from 04/29/2024 in [facility] Most recent reading at 04/29/2024 12:51 PM ED from 04/29/2024 in [facility] Most recent reading at 04/29/2024 12:07 PM ED from 03/26/2024 in [facility] Emergency Department at [facility] Most recent reading at 03/26/2024  6:25 PM  C-SSRS RISK CATEGORY No Risk No Risk No Risk     Musculoskeletal  Strength & Muscle Tone: within normal limits Gait & Station: normal Patient leans: N/A  Psychiatric Specialty Exam  Presentation  General Appearance:  Appropriate for Environment  Eye Contact: Fair  Speech: Clear and Coherent  Speech Volume: Normal  Handedness: Right   Mood and Affect  Mood: Dysphoric  Affect: Congruent; Tearful   Thought Process  Thought Processes: Coherent  Descriptions of Associations:Intact  Orientation:Full (Time, Place and Person)  Thought Content:Logical; Paranoid Ideation  Diagnosis of Schizophrenia or Schizoaffective disorder in past: Yes  Duration of Psychotic Symptoms: Greater than six months   Hallucinations:Hallucinations: None   Ideas of Reference:Paranoia  Suicidal Thoughts:No  Homicidal Thoughts:No    Sensorium  Memory: Immediate Fair; Recent Fair; Remote Fair  Judgment: Intact  Insight: Present   Executive Functions  Concentration: Fair  Attention Span: Fair  Recall: [employer] of Knowledge: Fair  Language: Fair   Psychomotor Activity  Psychomotor Activity: Psychomotor Activity: Normal    Assets  Assets: Communication Skills; Desire for Improvement   Sleep  Sleep: Sleep: Poor   Physical Exam  Physical Exam Constitutional:      Appearance: Normal appearance. Ill appearance: Chronically Ill appearing.  HENT:     Head: Normocephalic and atraumatic.     Nose: Nose normal.  Eyes:     Extraocular Movements: Extraocular movements intact.     Conjunctiva/sclera: Conjunctivae  normal.     Pupils: Pupils are equal, round, and reactive to light.  Cardiovascular:     Rate and Rhythm: Normal rate and regular rhythm.  Pulmonary:     Effort: Pulmonary effort is normal.     Breath sounds: Normal breath sounds.  Musculoskeletal:     Cervical back: Normal range of motion and neck supple.  Skin:  General: Skin is warm and dry.  Neurological:     General: No focal deficit present.     Mental Status: She is alert and oriented to person, place, and time.     Review of Systems  Constitutional: Negative.   HENT: Negative.    Eyes: Negative.   Respiratory: Negative.    Cardiovascular: Negative.   Gastrointestinal: Negative.   Genitourinary: Negative.   Musculoskeletal: Negative.   Psychiatric/Behavioral:  Negative for depression and substance abuse.     Blood pressure (!) 148/76, pulse 94, temperature 98.3 F (36.8 C), resp. rate 17, SpO2 99%. There is no height or weight on file to calculate BMI.  Treatment Plan Summary:  Patient admitted to the [facility] for cocaine abuse [facility]), no FDA approved treatment for cocaine use disorder, patient is asymptomatic of withdrawal symptoms.  Unspecified mood (affective) disorder (Primary), versus schizophrenia, patient has been without medicines per chart review since 2023 and there is no documentation of a previous diagnosis of schizophrenia therefore we will go with unspecified mood disorder in the context of substance use, Risperdal  resttarted at 1 mg daily and 2 mg at bedtime. Increased risperidone  at bedtime to 3 mg on 9/24 to improve mentla health symptoms interfering with sleep. Continue trazodone  as scheduled at bedtime. ECG normal sinus rhythm with some ST changes however this may be related to abnormal potassium level, patient is asymptomatic of any cardiac symptoms. QTc 460 will monitor for prolonged QT interval  Hypokalemia, resolved  Hypertension, Essential, improved, stable,  continue amlodipine  10 mg and spironolactone  25 mg. Measurements have improved.   Disposition, pending,- Patient would like residential treatment, has received multiple denials mostly due to insurance. SW continues to work on [employer].  Teresa Wyline CROME, NP 05/03/2024 4:50 PM

## 2024-05-03 NOTE — Group Note (Signed)
 Group Topic: Communication  Group Date: 05/03/2024 Start Time: 0900 End Time: 1000 Facilitators: Herold Lajuana NOVAK, RN  Department: [facility]  Number of Participants: 9  Group Focus: communication Treatment Modality:  Individual Therapy Interventions utilized were patient education Purpose: increase insight  Name: Jodi Kim Date of Birth: 10-21-1955  MR: [id number]    Level of Participation: active Quality of Participation: attentive Interactions with others: gave feedback Mood/Affect: appropriate Triggers (if applicable): none identified Cognition: coherent/clear Progress: Gaining insight Response: pt verbalized indication for all medications received Plan: patient will be encouraged to remain med compliant and notify staff with any SE of medications given  Patients Problems:  Patient Active Problem List   Diagnosis Date Noted   Polysubstance abuse (HCC) 04/29/2024

## 2024-05-03 NOTE — Group Note (Signed)
 Group Topic: Recovery Basics  Group Date: 05/03/2024 Start Time: 2000 End Time: 2100 Facilitators: Joan Plowman B  Department: [facility]  Number of Participants: 4  Group Focus: check in Treatment Modality:  Psychoeducation Interventions utilized were leisure development Purpose: express feelings  Name: Jodi Kim Date of Birth: 1955/11/17  MR: [id number]    Level of Participation: active Quality of Participation: attentive and cooperative Interactions with others: gave feedback Mood/Affect: appropriate Triggers (if applicable): NA Cognition: coherent/clear Progress: Gaining insight Response: NA Plan: patient will be encouraged to keep going to groups  Patients Problems:  Patient Active Problem List   Diagnosis Date Noted   Polysubstance abuse (HCC) 04/29/2024

## 2024-05-03 NOTE — ED Notes (Signed)
Pt sleeping at present, no distress noted,  Monitoring for safety. 

## 2024-05-03 NOTE — Care Management (Signed)
 [facility] Care Management  Writer met with the patient and discussed discharge planning.  Patient requests inpatient substance abuse treatment.    Plan:  Writer will follow up with a referral to [facility] in [facility] and [facility].  Patient will also continue to call the [facility] in order to obtain a shelter bed if she is not able to get into an inpatient substance abuse bed.   Patient has been declined at [facility] and [facility]

## 2024-05-04 DIAGNOSIS — E876 Hypokalemia: Secondary | ICD-10-CM | POA: Diagnosis not present

## 2024-05-04 DIAGNOSIS — I10 Essential (primary) hypertension: Secondary | ICD-10-CM | POA: Diagnosis not present

## 2024-05-04 DIAGNOSIS — F39 Unspecified mood [affective] disorder: Secondary | ICD-10-CM | POA: Diagnosis not present

## 2024-05-04 DIAGNOSIS — F1410 Cocaine abuse, uncomplicated: Secondary | ICD-10-CM | POA: Diagnosis not present

## 2024-05-04 MED ORDER — CLONIDINE HCL 0.1 MG PO TABS
0.1000 mg | ORAL_TABLET | Freq: Once | ORAL | Status: AC
  Administered 2024-05-04: 0.1 mg via ORAL
  Filled 2024-05-04: qty 1

## 2024-05-04 MED ORDER — TRAZODONE HCL 100 MG PO TABS
100.0000 mg | ORAL_TABLET | Freq: Every day | ORAL | Status: DC
  Administered 2024-05-04 – 2024-05-05 (×2): 100 mg via ORAL
  Filled 2024-05-04 (×2): qty 1

## 2024-05-04 NOTE — ED Notes (Signed)
 Pt sleeping at present, no distress noted.  Monitoring for safety.

## 2024-05-04 NOTE — Care Management (Signed)
 [facility] Care Management  Writer met with the patient and discussed discharge planning.  Patient needs to contact [facility] to to complete a telephone intake interview.  Writer encouraged patient to complete the intake interview.   Writer reminded patient that her discharge date is upcoming based on the MD.  Writer reminded patient to follow up with the list of shelter resources in the event that she is not able to get placed in a inpatient facility.   Writer reminded patient that she will need to complete a telephone intake interview with [facility] in order to receive placement.
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
## 2024-05-04 NOTE — ED Notes (Signed)
 Pt A&Ox4, in dayroom watching TV in NAD. Calm and cooperative. Will continue to monitor.

## 2024-05-04 NOTE — ED Notes (Signed)
 Pt sleeping in no acute distress. RR even and unlabored. Environment secured. Will continue to monitor for safety.

## 2024-05-04 NOTE — Discharge Instructions (Addendum)
 .. Substance Abuse Resources     SUBSTANCE USE TREATMENT for Medicaid and State Funded/IPRS  [facility] [address], [place], [address] [phone] phone NOTE: [facility] is no longer offering IOP services.  Serves those who are low-income or have no insurance.  [facility] [address] [place], [place], [address] [phone] phone [phone] fax NOTE: Does have Substance Abuse-Intensive Outpatient Program [facility]) as well as transitional housing if eligible.  [facility] [address] [place], [place], [address] [phone] phone [phone] fax  [facility] [phone] [place], [place], [address] [phone] phone [phone] fax    HALFWAY HOUSES:  [facility] [phone]  [employer].[email or web address]     12 STEP PROGRAMS:  Alcoholics Anonymous of [place] SoftwareChalet.be  Narcotics Anonymous of [place] HitProtect.dk  Al-Anon of [place], [place] www.[place]alanon.org/find-meetings.html  Nar-Anon https://nar-anon.org/find-a-meetin      List of Residential placements:   [facility] in [place]: [phone]  [facility]: [phone]  [place], [place] [phone]: Female and female facility; 30-day program: (uninsured and Medicaid such as Cheryll Corti, [place], [place], partners)  [facility]: [phone]; men and women's facility; 28 days; Can have Medicaid tailored plan Tour manager or Partners)  [facility]: [phone] Milda Aline or Tanis Fan; 28 day program; must be fully detox; tailored Medicaid or no insurance  [address] in [place], [place]; [phone]; 28 day all males program; no insurance accepted  [facility] Referral in [place]: Asa Lauth [phone] (no insurance or Medicaid only); 90 days; outpatient services but provide housing in apartments  [facility]  [facility] Admission: [phone]: Patient must complete phone screening for placement: [place], [place]; 6 month program; uninsured, Medicaid, and [employer].   [facility]: no insurance required; [phone]  [facility]: [phone]; Intake: Porfirio Bristol; Must fill out application online; Charlcie Conger Delay [phone] x [address] [facility] in [place], [place]: [phone]; Admissions Coordinators Mr. Cornel Diesel or Carolynne Citron; 90 day program.  [facility]: [place], [place] [phone]; Co-Ed 9 month to a year program; Online application; Men entry fee is $500 (6-28months);  [employer]: [address] [place], [place] [address]; no fee or insurance required; minimum of 2 years; Highly structured; work based; Intake Coordinator is Larinda Plover [phone]  [facility] in [place], [place]: [phone]; Fax number is [phone]; website: www.[facility].org; Requires 3-6 page autobiography; 2 year program (18 months and then 28month transitional housing); Admission fee is $300; no insurance needed; work Automotive engineer in [place], [place]: [employer] Desk Staff: Annalee Barren [phone]: They have a Men's Regenerations Program 6-40months. Free program; There is an initial $300 fee however, they are willing to work with patients regarding that. Application is online.  [facility]: Admissions [phone] Almeta Arm ext [phone]; Any 7-90 day program is out of pocket; 12 month program is free of charge; there is a $275 entry fee; Patient is responsible for own transportation    .[facility]  [facility] [address] [place], [place], [address] [phone] phone  Offers food and emergency or transitional housing to men, women, or families in need. Clients participate in programs and workshops developed to promote self-sufficiency and personal development.Call or walk in. Applications are accepted Monday, Wednesday,  and Friday by appointment only. Need photo ID and proof of income.  [facility] [address], [place], [place] [address] [phone] Population served: Adult men & women (5 years old  and older, able to perform activities for daily living) Documents required: Valid ID & Social Security Card  [facility] [address] [place], [place]  [address] [phone] Population served: Families with children  [facility] [address] [place], [place]  [address] [phone] Population served: Single women 18+ without dependents Documents required:  Valid ID & Social Security Card  [facility] [address], [place], [place]  [address] [phone] Population served: Female veterans 18+ with substance abuse/mental health issues Eligibility: By referral only  [facility] [address], [place], [place] [address] [phone] Population served: Males 18+ Documents required: Valid ID & Social Security Card  [facility] [employer] [facility] [place], [employer]. [address] [place] [address] [phone] or [phone] Population served: Pregnant women with or without children  Documents required: Valid ID & Social Security Ship broker of [employer] [address] [place], [place] [address] [phone] Population Served: Families with children  [facility] [address] [place], [place] [address] [phone] Population served: Men 18+, preference for disabled and/or veterans Eligibility: By referral only  [facility]) [facility] [address] [place], [place], [place] [address] [phone] or [phone] Population served: Families with children.    WOMEN ONLY  The Shelter serves up to 20 women each night. Open from December 11th through the end of March in the  evenings from 5:30 pm until 7:30 am, the Shelter provides a hot evening meal, shower and sleeping facilities, and food for the next day. Secure parking is available beside the building.     Shelter Address   Directions [facility] [address] [place], [place]  If you are in need of housing through the shelter, contact the [employer] at [phone].

## 2024-05-04 NOTE — [facility] Group Notes (Signed)
 SPIRITUALITY GROUP NOTE   Spirituality group facilitated by Chaplain Glenn Christo, MDiv, [facility].   Group Description:  Group focused on topic of hope.  Patients participated in facilitated discussion around topic, connecting with one another around experiences and definitions for hope.  Group members engaged with visual explorer photos, reflecting on what hope looks like for them today.  Group engaged in discussion around how their definitions of hope are present today in hospital.    Modalities: Psycho-social ed, Adlerian, Narrative, MI  Patient Progress:  Runette was present throughout group.  Actively engaged in discussion.  Contributions were on topic.

## 2024-05-04 NOTE — Group Note (Signed)
 Group Topic: Positive Affirmations  Group Date: 05/04/2024 Start Time: 0815 End Time: 0850 Facilitators: Lonzell Dwayne RAMAN, NT  Department: [facility]  Number of Participants: 4  Group Focus: coping skills Treatment Modality:  Psychoeducation Interventions utilized were clarification Purpose: explore maladaptive thinking  Name: Jodi Kim Date of Birth: 03-Feb-1956  MR: [id number]    Level of Participation: Patient attended groupactive Quality of Participation: attentive Interactions with others: gave feedback Mood/Affect: positive Triggers (if applicable): N/A Cognition: coherent/clear Progress: Moderate Response: Appropriate  Plan: follow-up needed  Patients Problems:  Patient Active Problem List   Diagnosis Date Noted   Polysubstance abuse (HCC) 04/29/2024

## 2024-05-04 NOTE — ED Notes (Signed)
 Pt sitting in dayroom watching television and interacting with peers. No acute distress noted. No concerns voiced. Informed pt to notify staff with any needs or assistance. Pt verbalized understanding and agreement. Will continue to monitor for safety.

## 2024-05-04 NOTE — ED Notes (Signed)
 Patient A&Ox4. Denies intent to harm self/others when asked. Denies A/VH. Patient denies any physical complaints when asked. No acute distress noted. Support and encouragement provided. Routine safety checks conducted according to facility protocol. Encouraged patient to notify staff if thoughts of harm toward self or others arise. Patient verbalize understanding and agreement. Will continue to monitor for safety.

## 2024-05-04 NOTE — Group Note (Signed)
 Group Topic: Relaxation  Group Date: 05/04/2024 Start Time: 0830 End Time: 0900 Facilitators: Nathanael [place], NT  Department: [facility]  Number of Participants: 5  Group Focus: art therapy, coping skills, and leisure skills Treatment Modality:  Art Therapy Interventions utilized were group exercise Purpose: enhance coping skills and express feelings  Name: Donna Snooks Date of Birth: May 23, 1956  MR: [id number]    Level of Participation: Denied when asked to join group Quality of Participation: Did not attend Interactions with others: N/A Mood/Affect: N/A Triggers (if applicable): N/A Cognition: N/A Progress: Other Response: N/A Plan: patient will be encouraged to attend future groups  Patients Problems:  Patient Active Problem List   Diagnosis Date Noted   Polysubstance abuse (HCC) 04/29/2024

## 2024-05-04 NOTE — ED Notes (Signed)
 Pt A&Ox4, calm & cooperative and in NAD at this time. Denies SI/HI/AVH. Contracts for safety. Encouragement and support given. Will continue to monitor.

## 2024-05-05 DIAGNOSIS — F1410 Cocaine abuse, uncomplicated: Secondary | ICD-10-CM | POA: Diagnosis not present

## 2024-05-05 DIAGNOSIS — I10 Essential (primary) hypertension: Secondary | ICD-10-CM | POA: Diagnosis not present

## 2024-05-05 DIAGNOSIS — E876 Hypokalemia: Secondary | ICD-10-CM | POA: Diagnosis not present

## 2024-05-05 DIAGNOSIS — F39 Unspecified mood [affective] disorder: Secondary | ICD-10-CM | POA: Diagnosis not present

## 2024-05-05 MED ORDER — QUETIAPINE FUMARATE ER 300 MG PO TB24
300.0000 mg | ORAL_TABLET | Freq: Every day | ORAL | Status: DC
  Administered 2024-05-05: 300 mg via ORAL
  Filled 2024-05-05 (×2): qty 1

## 2024-05-05 NOTE — ED Notes (Signed)
 Pt in AA meeting at present.

## 2024-05-05 NOTE — ED Notes (Signed)
 Pt in bed w/eyes closed resting quietly. Respirations equal and unlabored. No signs or symptoms of distress. Routine safety checks maintained/ongoing.

## 2024-05-05 NOTE — ED Notes (Signed)
 Pt observed lying in bed. Eyes closed respirations even and non labored. NAD q 15 minute observations continue for safety.

## 2024-05-05 NOTE — ED Notes (Signed)
Pt A&O x 4, no distress noted., calm & cooperative, monitoring for safety. 
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
## 2024-05-05 NOTE — Group Note (Signed)
 Group Topic: Decisional Balance/Substance Abuse  Group Date: 05/05/2024 Start Time: 8069 End Time: 2000 Facilitators: Verdon Jacqualyn BRAVO, NT  Department: [facility]  Number of Participants: 5  Group Focus: chemical dependency education Treatment Modality:  Individual Therapy Interventions utilized were group exercise Purpose: relapse prevention strategies  Name: Jodi Kim Date of Birth: 02-Feb-1956  MR: [id number]    Level of Participation: active Quality of Participation: cooperative Interactions with others: gave feedback Mood/Affect: appropriate Triggers (if applicable): n/a Cognition: coherent/clear Progress: Moderate Response: n/a Plan: follow-up needed  Patients Problems:  Patient Active Problem List   Diagnosis Date Noted   Polysubstance abuse (HCC) 04/29/2024

## 2024-05-05 NOTE — ED Notes (Signed)
 Pt has been calm and polite this shift.  Medications compliant and behaviors appropriate.  Q 15 minute observations for safety in place

## 2024-05-06 DIAGNOSIS — E876 Hypokalemia: Secondary | ICD-10-CM | POA: Diagnosis not present

## 2024-05-06 DIAGNOSIS — F1410 Cocaine abuse, uncomplicated: Secondary | ICD-10-CM | POA: Diagnosis not present

## 2024-05-06 DIAGNOSIS — I10 Essential (primary) hypertension: Secondary | ICD-10-CM | POA: Diagnosis not present

## 2024-05-06 DIAGNOSIS — F39 Unspecified mood [affective] disorder: Secondary | ICD-10-CM | POA: Diagnosis not present

## 2024-05-06 LAB — TSH: TSH: 5.57 u[IU]/mL — ABNORMAL HIGH (ref 0.350–4.500)

## 2024-05-06 LAB — T4, FREE: Free T4: 0.66 ng/dL (ref 0.61–1.12)

## 2024-05-06 MED ORDER — TRAZODONE HCL 100 MG PO TABS: 100.0000 mg | ORAL_TABLET | Freq: Every evening | ORAL | Status: DC | PRN

## 2024-05-06 MED ORDER — AMLODIPINE BESYLATE 10 MG PO TABS
10.0000 mg | ORAL_TABLET | Freq: Every day | ORAL | Status: DC
  Administered 2024-05-06: 10 mg via ORAL
  Filled 2024-05-06: qty 1

## 2024-05-06 MED ORDER — QUETIAPINE FUMARATE ER 200 MG PO TB24
400.0000 mg | ORAL_TABLET | Freq: Every day | ORAL | Status: DC
  Administered 2024-05-06: 400 mg via ORAL
  Filled 2024-05-06: qty 2

## 2024-05-06 NOTE — Group Note (Signed)
 Group Topic: Change and Accountability  Group Date: 05/06/2024 Start Time: 8069 End Time: 2000 Facilitators: Anice Benton LABOR, NT  Department: [facility]  Number of Participants: 4  Group Focus: acceptance and clarity of thought Treatment Modality:  Individual Therapy Interventions utilized were assignment Purpose: increase insight  Name: Tywanda Rice Date of Birth: [birth year]  MR: [id number]    Level of Participation: active Quality of Participation: attentive and cooperative Interactions with others: gave feedback Mood/Affect: appropriate and positive Triggers (if applicable): N/A Cognition: coherent/clear Progress: Moderate Response: Good Plan: follow-up needed  Patients Problems:  Patient Active Problem List   Diagnosis Date Noted   Polysubstance abuse (HCC) 04/29/2024

## 2024-05-06 NOTE — ED Notes (Signed)
 Patient awake for breakfast and is without complaint or withdrawal.  Patient asked for and was given 2 puffs of rescue inhaler.  She has now returned to bed and is asleep.  Will monitor.

## 2024-05-06 NOTE — ED Notes (Signed)
 Pt sleeping at present, no distress noted.  Monitoring for safety.
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
## 2024-05-06 NOTE — Progress Notes (Signed)
 Meal given at [facility]

## 2024-05-06 NOTE — Group Note (Signed)
 Group Topic: Positive Affirmations  Group Date: 05/06/2024 Start Time: 1300 End Time: 1345 Facilitators: Elnor Keven SAILOR  Department: [facility]  Number of Participants: 7  Group Focus: affirmation Treatment Modality:  Psychoeducation Interventions utilized were physical activity, support Purpose: reinforce self-care  Name: Jodi Kim Date of Birth: Nov 05, 1955  MR: [id number]    Level of Participation: active Quality of Participation: engaged Interactions with others: gave feedback Mood/Affect: positive Triggers (if applicable): NA Cognition: coherent/clear Progress: Significant Response: Pt shared multiple things she is grateful for including good food, sleep, being here. She shared that her affirmation of choice was I am exactly where I need to be. She also shared that she feels loved and feels free here.  Plan: follow-up needed  Patients Problems:  Patient Active Problem List   Diagnosis Date Noted   Polysubstance abuse (HCC) 04/29/2024

## 2024-05-06 NOTE — Progress Notes (Signed)
 Meal given

## 2024-05-07 DIAGNOSIS — F39 Unspecified mood [affective] disorder: Secondary | ICD-10-CM | POA: Diagnosis not present

## 2024-05-07 DIAGNOSIS — E876 Hypokalemia: Secondary | ICD-10-CM | POA: Diagnosis not present

## 2024-05-07 DIAGNOSIS — I10 Essential (primary) hypertension: Secondary | ICD-10-CM | POA: Diagnosis not present

## 2024-05-07 DIAGNOSIS — F1410 Cocaine abuse, uncomplicated: Secondary | ICD-10-CM | POA: Diagnosis not present

## 2024-05-07 MED ORDER — DICLOFENAC SODIUM 1 % EX GEL: 2.0000 g | Freq: Four times a day (QID) | CUTANEOUS | Status: DC

## 2024-05-07 MED ORDER — DICLOFENAC SODIUM 1 % EX GEL: 2.0000 g | Freq: Four times a day (QID) | CUTANEOUS | 0 refills | Status: AC

## 2024-05-07 MED ORDER — QUETIAPINE FUMARATE ER 400 MG PO TB24: 400.0000 mg | ORAL_TABLET | Freq: Every day | ORAL | 0 refills | Status: AC

## 2024-05-07 NOTE — Group Note (Signed)
 Group Topic: Understanding Self  Group Date: 05/07/2024 Start Time: 1210 End Time: 1230 Facilitators: Daved Tinnie HERO, RN  Department: [facility]  Number of Participants: 9  Group Focus: nursing group Treatment Modality:  Psychoeducation Interventions utilized were exploration Purpose: increase insight  Name: Cresco Carmack Date of Birth: 1955-08-14  MR: [id number]    Level of Participation: active Quality of Participation: attentive and cooperative Interactions with others: gave feedback Mood/Affect: flat Triggers (if applicable): n/a Cognition: coherent/clear Progress: Gaining insight Response: pt explored maslows hierarchy of needs, says they need to improve , self esteem, social and family relationships, and accept her limitations. Plan: patient will be encouraged to attend future RN education groups  Patients Problems:  Patient Active Problem List   Diagnosis Date Noted   Polysubstance abuse (HCC) 04/29/2024

## 2024-05-07 NOTE — ED Notes (Addendum)
 Patient alert & oriented x4. Denies intent to harm self or others when asked. Denies AH, endorses VH of shadows. Patient reports pain in L shoulder rating 6/10, prn medication given. Scheduled and PRN medications administered with no complications. No acute distress noted. Support and encouragement provided. Patient observed in milieu. No inappropriate behaviors observed or reported. Routine safety checks conducted per facility protocol. Encouraged patient to notify staff if any thoughts of harm towards self or others arise. Patient verbalizes understanding and agreement.
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
## 2024-05-07 NOTE — ED Notes (Signed)
 Paitent had lunch.

## 2024-05-07 NOTE — Care Management (Signed)
 [facility] Care Management  Writer met with the patient and discussed discharge planning.  Patient was not able to follow up with [facility] to secure her inpatient bed.  Writer discussed outpatient resources and homeless shelter options.  Writer placed outpatient resources and shelter options to include the [facility] in the patient discharge AVS.   Patient reports that she would like to go to her payee in [place] so that she is able to obtain her weekly cash stipend.   Writer encouraged patient to contact her payee.  Writer reminded the patient that it is up to the MD to determine her discharge date and time

## 2024-05-07 NOTE — Group Note (Signed)
 Group Topic: Fears and Unhealthy Coping Skills  Group Date: 05/07/2024 Start Time: 0900 End Time: 0950 Facilitators: Lonzell Dwayne RAMAN, NT  Department: [facility]  Number of Participants: 7  Group Focus: chemical dependency issues Treatment Modality:  Patient-Centered Therapy Interventions utilized were group exercise Purpose: reinforce self-care  Name: Jodi Kim Date of Birth: July 05, 1956  MR: [id number]    Level of Participation: Patient did attend groupactive Quality of Participation: attentive Interactions with others: gave feedback Mood/Affect: positive Triggers (if applicable): N/A Cognition: coherent/clear Progress: Moderate Response: Appropriate  Plan: follow-up needed  Patients Problems:  Patient Active Problem List   Diagnosis Date Noted   Polysubstance abuse (HCC) 04/29/2024

## 2024-05-07 NOTE — ED Notes (Addendum)
 Patient discharged home per MD order. After Visit Summary (AVS) printed and given to patient, as well as printed prescriptions. AVS reviewed with patient and all questions fully answered. Patient discharged in no acute distress, A& O x4 and ambulatory. Patient denied SI/HI, AH upon discharge. Patient verbalized understanding of all discharge instructions explained by staff, including follow up appointments, RX's and safety plan. Patient mood fair. Patient belongings returned to patient from locker #10 complete and intact. Patient escorted to back [place] via staff for transport to destination. Safety maintained.

## 2024-05-08 LAB — T3: T3, Total: 90 ng/dL (ref 71–180)
